# Patient Record
Sex: Male | Born: 1970 | Race: White | Hispanic: No | Marital: Single
Health system: Midwestern US, Academic
[De-identification: ages and names within clinical notes are randomized; demographics above are authoritative.]

---

## 2017-03-12 MED ORDER — THIAMINE MONONITRATE (VIT B1) 100 MG PO TAB
100 mg | Freq: Once | ORAL | 0 refills | Status: CP
Start: 2017-03-12 — End: ?

## 2017-03-12 MED ORDER — CHLORDIAZEPOXIDE HCL 25 MG PO CAP
25 mg | Freq: Once | ORAL | 0 refills | Status: CP
Start: 2017-03-12 — End: ?

## 2017-03-12 MED ORDER — FOLIC ACID 1 MG PO TAB
1 mg | Freq: Once | ORAL | 0 refills | Status: CP
Start: 2017-03-12 — End: ?

## 2017-03-12 MED ORDER — SODIUM CHLORIDE 0.9 % IV SOLP
1000 mL | INTRAVENOUS | 0 refills | Status: CP
Start: 2017-03-12 — End: ?

## 2017-03-19 MED ORDER — ALPRAZOLAM 0.5 MG PO TAB
.5 mg | Freq: Once | ORAL | 0 refills | Status: CP
Start: 2017-03-19 — End: ?

## 2017-05-13 ENCOUNTER — Emergency Department: Admit: 2017-05-13 | Discharge: 2017-05-13

## 2017-05-13 ENCOUNTER — Encounter: Admit: 2017-05-13 | Discharge: 2017-05-13

## 2017-05-13 ENCOUNTER — Emergency Department
Admit: 2017-05-13 | Discharge: 2017-05-13 | Disposition: A | Discharge status: Home / Self Care | Payer: BC Managed Care – PPO

## 2017-05-13 DIAGNOSIS — R569 Unspecified convulsions: Principal | ICD-10-CM

## 2017-05-13 DIAGNOSIS — F10220 Alcohol dependence with intoxication, uncomplicated: Principal | ICD-10-CM

## 2017-05-13 DIAGNOSIS — R531 Weakness: ICD-10-CM

## 2017-05-13 DIAGNOSIS — K859 Acute pancreatitis without necrosis or infection, unspecified: ICD-10-CM

## 2017-05-13 DIAGNOSIS — R410 Disorientation, unspecified: ICD-10-CM

## 2017-05-13 DIAGNOSIS — F1020 Alcohol dependence, uncomplicated: ICD-10-CM

## 2017-05-13 DIAGNOSIS — F4310 Post-traumatic stress disorder, unspecified: ICD-10-CM

## 2017-05-13 DIAGNOSIS — F10129 Alcohol abuse with intoxication, unspecified: ICD-10-CM

## 2017-05-13 DIAGNOSIS — M5126 Other intervertebral disc displacement, lumbar region: ICD-10-CM

## 2017-05-13 DIAGNOSIS — F329 Major depressive disorder, single episode, unspecified: ICD-10-CM

## 2017-05-13 DIAGNOSIS — N200 Calculus of kidney: ICD-10-CM

## 2017-05-13 LAB — COMPREHENSIVE METABOLIC PANEL
Lab: 0.4 mg/dL — ABNORMAL LOW (ref 0.3–1.2)
Lab: 0.7 mg/dL (ref 0.4–1.24)
Lab: 105 MMOL/L (ref 98–110)
Lab: 141 MMOL/L (ref 137–147)
Lab: 16 K/UL — ABNORMAL HIGH (ref 3–12)
Lab: 20 MMOL/L — ABNORMAL LOW (ref 21–30)
Lab: 34 U/L (ref 7–56)
Lab: 42 U/L — ABNORMAL HIGH (ref 7–40)
Lab: 57 U/L (ref 25–110)
Lab: 7.7 g/dL — ABNORMAL HIGH (ref 6.0–8.0)
Lab: 71 mg/dL (ref 70–100)
Lab: 8 mg/dL (ref 7–25)
Lab: 9.2 mg/dL (ref 8.5–10.6)
Lab: >60 mL/min (ref >60)
Lab: >60 mL/min (ref >60)

## 2017-05-13 LAB — CBC AND DIFF
Lab: 0 10*3/uL (ref 0–0.20)
Lab: 0 10*3/uL (ref 0–0.45)
Lab: 4.7 10*3/uL (ref 4.5–11.0)

## 2017-05-13 LAB — ALCOHOL LEVEL: Lab: 308 mg/dL (ref 3.5–5.1)

## 2017-05-13 LAB — ACETAMINOPHEN LEVEL: Lab: <10 ug/mL (ref <20.1)

## 2017-05-13 LAB — TRICYCLIC SCREEN: Lab: NEGATIVE

## 2017-05-13 LAB — SALICYLATE LEVEL

## 2017-05-13 LAB — MAGNESIUM: Lab: 2.1 mg/dL (ref 1.6–2.6)

## 2017-05-13 LAB — PHOSPHORUS: Lab: 2.8 mg/dL (ref 2.0–4.0)

## 2017-05-13 MED ORDER — LORAZEPAM 2 MG/ML IJ SOLN
2 mg | INTRAVENOUS | 0 refills | Status: CN
Start: 2017-05-13 — End: ?

## 2017-05-13 MED ORDER — LORAZEPAM 2 MG/ML IJ SOLN
1 mg | Freq: Two times a day (BID) | INTRAVENOUS | 0 refills | Status: CN
Start: 2017-05-13 — End: ?

## 2017-05-13 MED ORDER — LORAZEPAM 2 MG/ML IJ SOLN
1 mg | INTRAVENOUS | 0 refills | Status: CN
Start: 2017-05-13 — End: ?

## 2017-05-13 MED ORDER — LORAZEPAM 2 MG/ML IJ SOLN
1-2 mg | INTRAVENOUS | 0 refills | Status: CN | PRN
Start: 2017-05-13 — End: ?

## 2017-05-13 MED ORDER — VITAMIN B COMPLEX PO TAB
1 | Freq: Every day | ORAL | 0 refills | Status: CN
Start: 2017-05-13 — End: ?

## 2017-05-13 MED ORDER — THIAMINE MONONITRATE (VIT B1) 100 MG PO TAB
100 mg | Freq: Every day | ORAL | 0 refills | Status: DC
Start: 2017-05-13 — End: 2017-05-13

## 2017-05-13 MED ORDER — BANANA BAG (~~LOC~~) 1000ML
Freq: Once | INTRAVENOUS | 0 refills | Status: CP
Start: 2017-05-13 — End: ?
  Administered 2017-05-13 (×4): 1001.200 mL via INTRAVENOUS

## 2017-05-13 MED ORDER — LORAZEPAM 2 MG/ML IJ SOLN
1 mg | Freq: Once | INTRAVENOUS | 0 refills | Status: DC
Start: 2017-05-13 — End: 2017-05-13

## 2017-05-13 MED ORDER — TRAZODONE 100 MG PO TAB
100 mg | Freq: Every evening | ORAL | 0 refills | Status: CN | PRN
Start: 2017-05-13 — End: ?

## 2017-05-13 MED ORDER — PANTOPRAZOLE 40 MG PO TBEC
40 mg | Freq: Every day | ORAL | 0 refills | Status: CN
Start: 2017-05-13 — End: ?

## 2017-05-13 MED ORDER — FOLIC ACID 1 MG PO TAB
1 mg | Freq: Every day | ORAL | 0 refills | Status: DC
Start: 2017-05-13 — End: 2017-05-13

## 2017-05-13 MED ORDER — LORAZEPAM 2 MG/ML IJ SOLN
2 mg | Freq: Once | INTRAVENOUS | 0 refills | Status: CP
Start: 2017-05-13 — End: ?
  Administered 2017-05-13: 14:00:00 2 mg via INTRAVENOUS

## 2017-05-13 MED ORDER — GABAPENTIN 100 MG PO CAP
100 mg | Freq: Two times a day (BID) | ORAL | 0 refills | Status: CN
Start: 2017-05-13 — End: ?

## 2017-05-13 MED ORDER — THIAMINE MONONITRATE (VIT B1) 100 MG PO TAB
100 mg | Freq: Every day | ORAL | 0 refills | Status: CN
Start: 2017-05-13 — End: ?

## 2017-05-13 MED ORDER — FOLIC ACID 1 MG PO TAB
1 mg | Freq: Every day | ORAL | 0 refills | Status: CN
Start: 2017-05-13 — End: ?

## 2017-05-13 MED ORDER — ESCITALOPRAM OXALATE 10 MG PO TAB
10 mg | Freq: Every day | ORAL | 0 refills | Status: CN
Start: 2017-05-13 — End: ?

## 2017-05-13 NOTE — ED Notes
Patient verbalizes understanding of dc instructions and follow up care.  Ambulates out of Emergency Department without difficulty.

## 2017-05-13 NOTE — ED Notes
Patient removed yellow gown and walking out of ED, Wren notified that patient could not leave until reassessment performed by physician.

## 2017-05-13 NOTE — ED Notes
Unable to find Attending for an EKG reading @0835 . Will continue to attempt.

## 2017-05-13 NOTE — ED Notes
Dr. Allin at bedside to evaluate patient.

## 2017-05-13 NOTE — Care Coordination-Inpatient
Patient does not wish to be admitted at this time. I reviewed with Dr. Zenia Resides. Patient does not express SI/HI, and wishes to be discharged to home.     Dr. Zenia Resides intends to discharge patient. I will discontinue AOD request for admission. Please contact AOD if we can assist in the care of this patient.    Jacqualin Combes, MD  Internal Medicine

## 2017-05-13 NOTE — ED Notes
Patient sitting on bed, refusing to get into gown, IV fluids completed after reconnected, patient wants to go home, states he will sign AMA, informed patient that the presentation he had had MD stating he was unable to make decisions.  Patient A&O x 4, states he needs to go home, sleep then take a shower and go to work.  Patient requesting coffee, Dr. Lance Sell notified of patient's desire to leave, patient given cup of coffee.

## 2017-05-14 ENCOUNTER — Encounter: Admit: 2017-05-14 | Discharge: 2017-05-14

## 2017-05-14 ENCOUNTER — Emergency Department: Admit: 2017-05-14 | Discharge: 2017-05-14 | Attending: Emergency Medical Services

## 2017-05-14 ENCOUNTER — Emergency Department
Admit: 2017-05-14 | Discharge: 2017-05-14 | Disposition: A | Discharge status: Home / Self Care | Payer: BC Managed Care – PPO | Attending: Emergency Medical Services

## 2017-05-14 DIAGNOSIS — N200 Calculus of kidney: ICD-10-CM

## 2017-05-14 DIAGNOSIS — F329 Major depressive disorder, single episode, unspecified: ICD-10-CM

## 2017-05-14 DIAGNOSIS — F4310 Post-traumatic stress disorder, unspecified: ICD-10-CM

## 2017-05-14 DIAGNOSIS — M25571 Pain in right ankle and joints of right foot: Principal | ICD-10-CM

## 2017-05-14 DIAGNOSIS — F10220 Alcohol dependence with intoxication, uncomplicated: Secondary | ICD-10-CM

## 2017-05-14 DIAGNOSIS — F1020 Alcohol dependence, uncomplicated: ICD-10-CM

## 2017-05-14 DIAGNOSIS — M5126 Other intervertebral disc displacement, lumbar region: ICD-10-CM

## 2017-05-14 DIAGNOSIS — K859 Acute pancreatitis without necrosis or infection, unspecified: ICD-10-CM

## 2017-05-14 DIAGNOSIS — R569 Unspecified convulsions: Principal | ICD-10-CM

## 2017-05-14 LAB — POC GLUCOSE: Lab: 79 mg/dL (ref 70–100)

## 2017-05-14 MED ORDER — ACETAMINOPHEN 500 MG PO TAB
1000 mg | Freq: Once | ORAL | 0 refills | Status: CP
Start: 2017-05-14 — End: ?
  Administered 2017-05-14: 22:00:00 1000 mg via ORAL

## 2017-05-14 MED ORDER — FOLIC ACID 1 MG PO TAB
1 mg | Freq: Once | ORAL | 0 refills | Status: CP
Start: 2017-05-14 — End: ?
  Administered 2017-05-14: 22:00:00 1 mg via ORAL

## 2017-05-14 MED ORDER — LACTATED RINGERS IV SOLP
1000 mL | INTRAVENOUS | 0 refills | Status: CP
Start: 2017-05-14 — End: ?
  Administered 2017-05-14: 22:00:00 1000 mL via INTRAVENOUS

## 2017-05-14 MED ORDER — THIAMINE MONONITRATE (VIT B1) 100 MG PO TAB
100 mg | Freq: Once | ORAL | 0 refills | Status: CP
Start: 2017-05-14 — End: ?
  Administered 2017-05-14: 22:00:00 100 mg via ORAL

## 2017-05-14 NOTE — ED Notes
Per Dr. Meryl Crutch PO challenge pt, pt given crackers and water.

## 2017-05-14 NOTE — ED Notes
Discharge instructions discussed with pt. Pt verbalizes understanding, denies further questions or concerns at this time. Pt A&O x 4, resp even and NL, VSS, pt ambulatory with a steady gait. Pt called his wife to pick him up.

## 2017-05-14 NOTE — ED Notes
Pt ambulatory with a steady gait, pt limping slightly due to right ankle pain. Pt states "I'm fine." Primary RN notified.

## 2017-05-24 ENCOUNTER — Emergency Department
Admit: 2017-05-24 | Discharge: 2017-05-24 | Disposition: A | Discharge status: Home / Self Care | Payer: BC Managed Care – PPO

## 2017-05-24 ENCOUNTER — Emergency Department: Admit: 2017-05-24 | Discharge: 2017-05-24

## 2017-05-24 DIAGNOSIS — F1099 Alcohol use, unspecified with unspecified alcohol-induced disorder: Principal | ICD-10-CM

## 2017-05-24 LAB — BASIC METABOLIC PANEL
Lab: 0.7 mg/dL (ref 0.4–1.24)
Lab: 107 mg/dL — ABNORMAL HIGH (ref 70–100)
Lab: 12 pg (ref 3–12)
Lab: 141 MMOL/L (ref 137–147)
Lab: 25 MMOL/L (ref 21–30)
Lab: 3.4 MMOL/L — ABNORMAL LOW (ref 3.5–5.1)
Lab: 8 mg/dL (ref 7–25)
Lab: 9.6 mg/dL — ABNORMAL LOW (ref 8.5–10.6)
Lab: >60 mL/min (ref >60)
Lab: >60 mL/min (ref >60)

## 2017-05-24 LAB — CBC AND DIFF
Lab: 0 % (ref 0–2)
Lab: 0 10*3/uL (ref 0–0.20)
Lab: 0.1 10*3/uL (ref 0–0.45)
Lab: 0.5 10*3/uL (ref 0–0.80)
Lab: 1 % (ref 0–5)
Lab: 1.2 10*3/uL (ref 1.0–4.8)
Lab: 10 % (ref 4–12)
Lab: 3.3 10*3/uL (ref 1.8–7.0)
Lab: 5.1 K/UL (ref 4.5–11.0)

## 2017-05-24 LAB — ALCOHOL LEVEL: Lab: 184 mg/dL — ABNORMAL LOW (ref 98–106)

## 2017-05-24 LAB — MAGNESIUM: Lab: 2.1 mg/dL (ref 1.6–2.6)

## 2017-05-24 MED ORDER — FOLIC ACID 1 MG PO TAB
1 mg | Freq: Once | ORAL | 0 refills | Status: CP
Start: 2017-05-24 — End: ?
  Administered 2017-05-24: 19:00:00 1 mg via ORAL

## 2017-05-24 MED ORDER — PHENOBARBITAL SODIUM 130 MG/ML IJ SOLN
130 mg | Freq: Once | INTRAVENOUS | 0 refills | Status: CP
Start: 2017-05-24 — End: ?
  Administered 2017-05-24: 20:00:00 130 mg via INTRAVENOUS

## 2017-05-24 MED ORDER — LACTATED RINGERS IV SOLP
1000 mL | INTRAVENOUS | 0 refills | Status: CP
Start: 2017-05-24 — End: ?
  Administered 2017-05-24: 19:00:00 1000 mL via INTRAVENOUS

## 2017-05-24 MED ORDER — THIAMINE MONONITRATE (VIT B1) 100 MG PO TAB
100 mg | Freq: Once | ORAL | 0 refills | Status: CP
Start: 2017-05-24 — End: ?
  Administered 2017-05-24: 19:00:00 100 mg via ORAL

## 2017-05-24 MED ORDER — CHLORDIAZEPOXIDE HCL 25 MG PO CAP
ORAL_CAPSULE | ORAL | 0 refills | 5.00000 days | Status: AC
Start: 2017-05-24 — End: 2017-12-30

## 2017-05-24 MED ORDER — PHENOBARBITAL SODIUM 130 MG/ML IJ SOLN
160 mg | Freq: Once | INTRAVENOUS | 0 refills | Status: DC
Start: 2017-05-24 — End: 2017-05-24

## 2017-05-24 NOTE — ED Notes
Patient given discharge instructions. Cab voucher and prescription at triage desk. PIV removed. VSS. Patient ambulated with steady gait to triage to wait for cab.

## 2017-05-24 NOTE — ED Notes
SW notified to work on Clinical cytogeneticist for patient.

## 2017-05-24 NOTE — ED Notes
This staff assisted Dr. Phylliss Bob in finding a Detox for him. This staff called [facility] to make arrangements for him to go to [facility]. Colletta Maryland at [facility] did a phone interview with him and this staff did a F/U call with [facility]. [facility] accepted him and wanted him there at 1730. [facility] asked for a recent BAL and a script for Librium Taper. He received Phenobarb today. Dr. Phylliss Bob wrote a script for Librium and faxed it to [facility]. He will be transported to [facility] by cab voucher. Report to his Nurse.

## 2017-05-24 NOTE — Case Management (ED)
Case Management Progress Note    NAME:Daniel French                          MRN: [id number]              DOB:09/01/1971          AGE: 45 y.o.  ADMISSION DATE: 05/24/2017             DAYS ADMITTED: LOS: 0 days      Todays Date: 05/24/2017    Plan: Pt dc to [facility] with transportation assistance.    Interventions  ? Support      ? Info or Referral      ? Discharge Planning   Discharge Planning: Transportation Arrangements and/or Resources    SW contacted by Denny Peon, Therapist, sports, requesting SW assistance.   Leah reported pt will dc to [facility] but facility requesting pt be present prior to 5:30 and will need transportation assistance.    SW provided Leah with cab voucher for pt dc.  ? Medication Needs      ? Financial      ? Legal      ? Other        Disposition  ? Expected Discharge Date       ? Transportation      ? Next Level of Care (Acute Psych discharges only)      ? Discharge Disposition                                          Durable Medical Equipment     No service has been selected for the patient.      [place] Destination     No service has been selected for the patient.      [place]     No service has been selected for the patient.      [facility]     No service has been selected for the patient.        Maleke Feria SW  [phone]

## 2017-10-14 ENCOUNTER — Encounter: Admit: 2017-10-14 | Discharge: 2017-10-14

## 2017-10-14 MED ORDER — DEXTROAMPHETAMINE-AMPHETAMINE 10 MG PO TAB
10 mg | ORAL_TABLET | Freq: Three times a day (TID) | ORAL | 0 refills | 30.00000 days | Status: DC
Start: 2017-10-14 — End: 2017-11-09
  Filled 2017-10-14 (×2): qty 90, 30d supply, fill #1

## 2017-10-19 ENCOUNTER — Encounter: Admit: 2017-10-19 | Discharge: 2017-10-19

## 2017-10-19 DIAGNOSIS — M5030 Other cervical disc degeneration, unspecified cervical region: ICD-10-CM

## 2017-10-19 DIAGNOSIS — M5412 Radiculopathy, cervical region: Principal | ICD-10-CM

## 2017-10-28 ENCOUNTER — Ambulatory Visit: Admit: 2017-10-28 | Discharge: 2017-10-28

## 2017-10-28 DIAGNOSIS — M5412 Radiculopathy, cervical region: Principal | ICD-10-CM

## 2017-10-28 DIAGNOSIS — M5030 Other cervical disc degeneration, unspecified cervical region: ICD-10-CM

## 2017-11-09 ENCOUNTER — Encounter: Admit: 2017-11-09 | Discharge: 2017-11-09

## 2017-11-09 MED ORDER — DEXTROAMPHETAMINE-AMPHETAMINE 10 MG PO TAB
10 mg | ORAL_TABLET | Freq: Three times a day (TID) | ORAL | 0 refills | Status: DC
Start: 2017-11-09 — End: 2017-12-01
  Filled 2017-11-13 (×2): qty 90, 30d supply, fill #1

## 2017-11-13 ENCOUNTER — Encounter: Admit: 2017-11-13 | Discharge: 2017-11-13

## 2017-12-01 ENCOUNTER — Encounter: Admit: 2017-12-01 | Discharge: 2017-12-01

## 2017-12-01 MED ORDER — DEXTROAMPHETAMINE-AMPHETAMINE 10 MG PO TAB
10 mg | ORAL_TABLET | Freq: Three times a day (TID) | ORAL | 0 refills | Status: DC
Start: 2017-12-01 — End: 2017-12-30
  Filled 2017-12-08 (×2): qty 90, 30d supply, fill #1

## 2017-12-02 ENCOUNTER — Encounter: Admit: 2017-12-02 | Discharge: 2017-12-02

## 2017-12-03 ENCOUNTER — Encounter: Admit: 2017-12-03 | Discharge: 2017-12-03

## 2017-12-05 ENCOUNTER — Encounter: Admit: 2017-12-05 | Discharge: 2017-12-05

## 2017-12-07 ENCOUNTER — Encounter: Admit: 2017-12-07 | Discharge: 2017-12-07

## 2017-12-08 ENCOUNTER — Encounter: Admit: 2017-12-08 | Discharge: 2017-12-08

## 2017-12-08 ENCOUNTER — Ambulatory Visit: Admit: 2017-12-08 | Discharge: 2017-12-09

## 2017-12-08 DIAGNOSIS — R569 Unspecified convulsions: Principal | ICD-10-CM

## 2017-12-08 DIAGNOSIS — K859 Acute pancreatitis without necrosis or infection, unspecified: ICD-10-CM

## 2017-12-08 DIAGNOSIS — N200 Calculus of kidney: ICD-10-CM

## 2017-12-08 DIAGNOSIS — F4310 Post-traumatic stress disorder, unspecified: ICD-10-CM

## 2017-12-08 DIAGNOSIS — F329 Major depressive disorder, single episode, unspecified: ICD-10-CM

## 2017-12-08 DIAGNOSIS — M5126 Other intervertebral disc displacement, lumbar region: ICD-10-CM

## 2017-12-08 DIAGNOSIS — F1020 Alcohol dependence, uncomplicated: ICD-10-CM

## 2017-12-08 MED ORDER — OXYCODONE-ACETAMINOPHEN 5-325 MG PO TAB
1 | ORAL_TABLET | Freq: Every evening | ORAL | 0 refills | 2.00000 days | Status: AC | PRN
Start: 2017-12-08 — End: 2017-12-30
  Filled 2017-12-08 (×2): qty 20, 20d supply, fill #1

## 2017-12-08 MED ORDER — MELOXICAM 7.5 MG PO TAB
7.5 mg | ORAL_TABLET | Freq: Every day | ORAL | 3 refills | 30.00000 days | Status: AC
Start: 2017-12-08 — End: 2018-09-27
  Filled 2017-12-12 (×2): qty 60, 60d supply, fill #1

## 2017-12-09 DIAGNOSIS — M5412 Radiculopathy, cervical region: ICD-10-CM

## 2017-12-09 DIAGNOSIS — M5030 Other cervical disc degeneration, unspecified cervical region: Principal | ICD-10-CM

## 2017-12-12 ENCOUNTER — Encounter: Admit: 2017-12-12 | Discharge: 2017-12-12

## 2017-12-30 ENCOUNTER — Ambulatory Visit: Admit: 2017-12-30 | Discharge: 2017-12-31

## 2017-12-30 ENCOUNTER — Encounter: Admit: 2017-12-30 | Discharge: 2017-12-30

## 2017-12-30 DIAGNOSIS — F1020 Alcohol dependence, uncomplicated: ICD-10-CM

## 2017-12-30 DIAGNOSIS — F329 Major depressive disorder, single episode, unspecified: ICD-10-CM

## 2017-12-30 DIAGNOSIS — M542 Cervicalgia: Principal | ICD-10-CM

## 2017-12-30 DIAGNOSIS — F4310 Post-traumatic stress disorder, unspecified: ICD-10-CM

## 2017-12-30 DIAGNOSIS — M5126 Other intervertebral disc displacement, lumbar region: ICD-10-CM

## 2017-12-30 DIAGNOSIS — M5030 Other cervical disc degeneration, unspecified cervical region: Principal | ICD-10-CM

## 2017-12-30 DIAGNOSIS — R569 Unspecified convulsions: Principal | ICD-10-CM

## 2017-12-30 DIAGNOSIS — N200 Calculus of kidney: ICD-10-CM

## 2017-12-30 DIAGNOSIS — K859 Acute pancreatitis without necrosis or infection, unspecified: ICD-10-CM

## 2017-12-30 MED ORDER — TRIAMCINOLONE ACETONIDE 40 MG/ML IJ SUSP
80 mg | Freq: Once | EPIDURAL | 0 refills | Status: CP
Start: 2017-12-30 — End: ?
  Administered 2017-12-30: 14:00:00 80 mg via EPIDURAL

## 2017-12-30 MED ORDER — OXYCODONE-ACETAMINOPHEN 5-325 MG PO TAB
1 | ORAL_TABLET | Freq: Every evening | ORAL | 0 refills | 2.00000 days | Status: AC | PRN
Start: 2017-12-30 — End: 2018-09-27
  Filled 2017-12-30 (×2): qty 20, 20d supply, fill #1

## 2017-12-30 MED ORDER — TRIAMCINOLONE ACETONIDE 40 MG/ML IJ SUSP
80 mg | Freq: Once | EPIDURAL | 0 refills | Status: DC
Start: 2017-12-30 — End: 2017-12-30

## 2017-12-30 MED ORDER — IOPAMIDOL 41 % IT SOLN
2.5 mL | Freq: Once | EPIDURAL | 0 refills | Status: CP
Start: 2017-12-30 — End: ?
  Administered 2017-12-30: 14:00:00 2.5 mL via EPIDURAL

## 2017-12-30 MED ORDER — IOPAMIDOL 41 % IT SOLN
2.5 mL | Freq: Once | EPIDURAL | 0 refills | Status: DC
Start: 2017-12-30 — End: 2017-12-30

## 2017-12-31 ENCOUNTER — Ambulatory Visit: Admit: 2017-12-30 | Discharge: 2017-12-31

## 2017-12-31 DIAGNOSIS — M5412 Radiculopathy, cervical region: ICD-10-CM

## 2017-12-31 DIAGNOSIS — F329 Major depressive disorder, single episode, unspecified: ICD-10-CM

## 2017-12-31 DIAGNOSIS — F4310 Post-traumatic stress disorder, unspecified: ICD-10-CM

## 2018-01-04 ENCOUNTER — Encounter: Admit: 2018-01-04 | Discharge: 2018-01-04

## 2018-01-04 MED ORDER — DEXTROAMPHETAMINE-AMPHETAMINE 10 MG PO TAB
10 mg | ORAL_TABLET | Freq: Three times a day (TID) | ORAL | 0 refills | 30.00000 days | Status: DC
Start: 2018-01-04 — End: 2018-10-06
  Filled 2018-01-04 (×2): qty 90, 30d supply, fill #1

## 2018-01-12 ENCOUNTER — Encounter: Admit: 2018-01-12 | Discharge: 2018-01-12

## 2018-01-28 ENCOUNTER — Encounter: Admit: 2018-01-28 | Discharge: 2018-01-28

## 2018-01-28 DIAGNOSIS — N200 Calculus of kidney: ICD-10-CM

## 2018-01-28 DIAGNOSIS — F329 Major depressive disorder, single episode, unspecified: ICD-10-CM

## 2018-01-28 DIAGNOSIS — R569 Unspecified convulsions: Principal | ICD-10-CM

## 2018-01-28 DIAGNOSIS — F4310 Post-traumatic stress disorder, unspecified: ICD-10-CM

## 2018-01-28 DIAGNOSIS — F1020 Alcohol dependence, uncomplicated: ICD-10-CM

## 2018-01-28 DIAGNOSIS — M5126 Other intervertebral disc displacement, lumbar region: ICD-10-CM

## 2018-01-28 DIAGNOSIS — F10129 Alcohol abuse with intoxication, unspecified: ICD-10-CM

## 2018-01-28 DIAGNOSIS — K859 Acute pancreatitis without necrosis or infection, unspecified: ICD-10-CM

## 2018-01-29 ENCOUNTER — Emergency Department
Admit: 2018-01-29 | Discharge: 2018-01-29 | Discharge status: Against Medical Advice | Attending: Student in an Organized Health Care Education/Training Program

## 2018-01-29 DIAGNOSIS — Z5321 Procedure and treatment not carried out due to patient leaving prior to being seen by health care provider: Principal | ICD-10-CM

## 2018-02-02 ENCOUNTER — Encounter: Admit: 2018-02-02 | Discharge: 2018-02-02

## 2018-02-02 ENCOUNTER — Emergency Department: Admit: 2018-02-02 | Discharge: 2018-02-03

## 2018-02-02 ENCOUNTER — Emergency Department: Admit: 2018-02-02 | Discharge: 2018-02-02 | Disposition: A | Discharge status: Home / Self Care

## 2018-02-02 ENCOUNTER — Emergency Department: Admit: 2018-02-02 | Discharge: 2018-02-02

## 2018-02-02 DIAGNOSIS — N200 Calculus of kidney: ICD-10-CM

## 2018-02-02 DIAGNOSIS — R569 Unspecified convulsions: Principal | ICD-10-CM

## 2018-02-02 DIAGNOSIS — F329 Major depressive disorder, single episode, unspecified: ICD-10-CM

## 2018-02-02 DIAGNOSIS — F10129 Alcohol abuse with intoxication, unspecified: ICD-10-CM

## 2018-02-02 DIAGNOSIS — R030 Elevated blood-pressure reading, without diagnosis of hypertension: ICD-10-CM

## 2018-02-02 DIAGNOSIS — S0081XA Abrasion of other part of head, initial encounter: ICD-10-CM

## 2018-02-02 DIAGNOSIS — R000 Tachycardia, unspecified: ICD-10-CM

## 2018-02-02 DIAGNOSIS — F4310 Post-traumatic stress disorder, unspecified: ICD-10-CM

## 2018-02-02 DIAGNOSIS — F1010 Alcohol abuse, uncomplicated: Principal | ICD-10-CM

## 2018-02-02 DIAGNOSIS — F1020 Alcohol dependence, uncomplicated: ICD-10-CM

## 2018-02-02 DIAGNOSIS — K859 Acute pancreatitis without necrosis or infection, unspecified: ICD-10-CM

## 2018-02-02 DIAGNOSIS — M5126 Other intervertebral disc displacement, lumbar region: ICD-10-CM

## 2018-02-02 LAB — COMPREHENSIVE METABOLIC PANEL
Lab: 0.6 mg/dL (ref 0.4–1.24)
Lab: 104 mg/dL — ABNORMAL HIGH (ref 70–100)
Lab: 14 mg/dL (ref 7–25)
Lab: 140 MMOL/L — ABNORMAL LOW (ref 137–147)
Lab: 15 10*3/uL — ABNORMAL HIGH (ref 3–12)
Lab: 24 MMOL/L (ref 21–30)
Lab: 34 U/L (ref 7–56)
Lab: 35 U/L (ref 7–40)
Lab: 7.1 g/dL (ref 6.0–8.0)
Lab: 8.7 mg/dL (ref 8.5–10.6)
Lab: >60 mL/min (ref >60)
Lab: >60 mL/min (ref >60)

## 2018-02-02 LAB — CBC AND DIFF
Lab: 0 10*3/uL (ref 0–0.20)
Lab: 0 10*3/uL (ref 0–0.45)
Lab: 5.6 10*3/uL (ref 4.5–11.0)

## 2018-02-02 LAB — POC GLUCOSE: Lab: 126 mg/dL — ABNORMAL HIGH (ref 70–100)

## 2018-02-02 MED ORDER — LACTATED RINGERS IV SOLP
1000 mL | INTRAVENOUS | 0 refills | Status: CP
Start: 2018-02-02 — End: ?
  Administered 2018-02-02: 12:00:00 1000 mL via INTRAVENOUS

## 2018-02-02 MED ORDER — FOLIC ACID 1 MG PO TAB
1 mg | Freq: Once | ORAL | 0 refills | Status: CP
Start: 2018-02-02 — End: ?
  Administered 2018-02-02: 12:00:00 1 mg via ORAL

## 2018-02-02 MED ORDER — SODIUM CHLORIDE 0.9 % IV SOLP
INTRAVENOUS | 0 refills | Status: DC
Start: 2018-02-02 — End: 2018-02-03

## 2018-02-02 MED ORDER — LACTATED RINGERS IV SOLP
1000 mL | INTRAVENOUS | 0 refills | Status: CP
Start: 2018-02-02 — End: ?
  Administered 2018-02-02: 20:00:00 1000 mL via INTRAVENOUS

## 2018-02-02 MED ORDER — THIAMINE MONONITRATE (VIT B1) 100 MG PO TAB
100 mg | Freq: Once | ORAL | 0 refills | Status: CP
Start: 2018-02-02 — End: ?
  Administered 2018-02-02: 12:00:00 100 mg via ORAL

## 2018-02-03 ENCOUNTER — Emergency Department: Admit: 2018-02-03 | Discharge: 2018-02-03

## 2018-02-03 ENCOUNTER — Encounter: Admit: 2018-02-03 | Discharge: 2018-02-03

## 2018-02-03 DIAGNOSIS — S0181XA Laceration without foreign body of other part of head, initial encounter: ICD-10-CM

## 2018-02-03 DIAGNOSIS — M542 Cervicalgia: ICD-10-CM

## 2018-02-03 LAB — BASIC METABOLIC PANEL
Lab: 127 mg/dL — ABNORMAL HIGH (ref 70–100)
Lab: 141 MMOL/L (ref 137–147)
Lab: 15 % — ABNORMAL HIGH (ref 3–12)
Lab: 27 MMOL/L (ref 21–30)
Lab: 4 MMOL/L (ref 3.5–5.1)
Lab: 8.9 mg/dL (ref 8.5–10.6)
Lab: 99 MMOL/L (ref 98–110)
Lab: >60 mL/min (ref >60)
Lab: 0.6 mg/dL (ref 0.4–1.24)
Lab: 10 mg/dL (ref 7–25)
Lab: 100 MMOL/L (ref 98–110)
Lab: 13 — ABNORMAL HIGH (ref 3–12)
Lab: 138 MMOL/L (ref 137–147)
Lab: 25 MMOL/L (ref 21–30)
Lab: 3.6 MMOL/L (ref 3.5–5.1)
Lab: 79 mg/dL (ref 70–100)
Lab: 8.3 mg/dL — ABNORMAL LOW (ref 8.5–10.6)
Lab: >60 mL/min (ref >60)
Lab: >60 mL/min (ref >60)

## 2018-02-03 LAB — BETA-HCG: Lab: <1 U/L (ref <5)

## 2018-02-03 LAB — PROTIME INR (PT): Lab: 1 M/UL (ref 0.8–1.2)

## 2018-02-03 LAB — AMPHETAMINES-URINE RANDOM: Lab: POSITIVE — AB

## 2018-02-03 LAB — CREATINE KINASE-CPK: Lab: 821 U/L — ABNORMAL HIGH (ref 35–232)

## 2018-02-03 LAB — OPIATES-URINE RANDOM: Lab: NEGATIVE

## 2018-02-03 LAB — LACTIC ACID (BG - RAPID LACTATE): Lab: 7.1 MMOL/L — ABNORMAL HIGH (ref 0.5–2.0)

## 2018-02-03 LAB — BETA HYDROXYBUTYRATE (KETONES): Lab: 0.2 MMOL/L (ref <0.3)

## 2018-02-03 LAB — HEMOGLOBIN & HEMATOCRIT
Lab: 12 g/dL — ABNORMAL LOW (ref 13.5–16.5)
Lab: 36 % — ABNORMAL LOW (ref 40–50)

## 2018-02-03 LAB — ALCOHOL LEVEL: Lab: 497 mg/dL (ref 0.4–1.24)

## 2018-02-03 LAB — LACTIC ACID(LACTATE): Lab: 3.8 MMOL/L — ABNORMAL HIGH (ref 0.5–2.0)

## 2018-02-03 LAB — BARBITURATES-URINE RANDOM: Lab: NEGATIVE

## 2018-02-03 LAB — CBC: Lab: 5.8 10*3/uL (ref 4.5–11.0)

## 2018-02-03 LAB — BENZODIAZEPINES-URINE RANDOM: Lab: NEGATIVE

## 2018-02-03 LAB — ACETAMINOPHEN LEVEL: Lab: <10 ug/mL (ref <20.1)

## 2018-02-03 LAB — PHENCYCLIDINES-URINE RANDOM: Lab: NEGATIVE

## 2018-02-03 LAB — COCAINE-URINE RANDOM: Lab: NEGATIVE

## 2018-02-03 LAB — POC LACTATE: Lab: 4.7 MMOL/L — ABNORMAL HIGH (ref 0.5–2.0)

## 2018-02-03 LAB — SALICYLATE LEVEL

## 2018-02-03 LAB — TROPONIN-I: Lab: 0 ng/mL (ref 0.0–0.05)

## 2018-02-03 LAB — PTT (APTT): Lab: 22 s — ABNORMAL LOW (ref 24.0–36.5)

## 2018-02-03 LAB — CANNABINOIDS-URINE RANDOM: Lab: NEGATIVE

## 2018-02-03 MED ORDER — LORAZEPAM 2 MG/ML IJ SOLN
1-2 mg | INTRAVENOUS | 0 refills | Status: DC | PRN
Start: 2018-02-03 — End: 2018-02-07
  Administered 2018-02-03 – 2018-02-04 (×2): 1 mg via INTRAVENOUS
  Administered 2018-02-04 – 2018-02-05 (×3): 2 mg via INTRAVENOUS
  Administered 2018-02-05: 03:00:00 1 mg via INTRAVENOUS
  Administered 2018-02-05 – 2018-02-07 (×8): 2 mg via INTRAVENOUS

## 2018-02-03 MED ORDER — GABAPENTIN 300 MG PO CAP
1200 mg | Freq: Once | ORAL | 0 refills | Status: CP
Start: 2018-02-03 — End: ?
  Administered 2018-02-03: 18:00:00 1200 mg via ORAL

## 2018-02-03 MED ORDER — ENOXAPARIN 40 MG/0.4 ML SC SYRG
40 mg | Freq: Every day | SUBCUTANEOUS | 0 refills | Status: DC
Start: 2018-02-03 — End: 2018-02-08
  Administered 2018-02-04 – 2018-02-08 (×3): 40 mg via SUBCUTANEOUS

## 2018-02-03 MED ORDER — TRAMADOL 50 MG PO TAB
50 mg | ORAL | 0 refills | Status: DC | PRN
Start: 2018-02-03 — End: 2018-02-05
  Administered 2018-02-03 – 2018-02-05 (×6): 50 mg via ORAL

## 2018-02-03 MED ORDER — ONDANSETRON HCL (PF) 4 MG/2 ML IJ SOLN
4-8 mg | INTRAVENOUS | 0 refills | Status: DC | PRN
Start: 2018-02-03 — End: 2018-02-08

## 2018-02-03 MED ORDER — FENTANYL CITRATE (PF) 50 MCG/ML IJ SOLN
25-50 ug | INTRAVENOUS | 0 refills | Status: DC | PRN
Start: 2018-02-03 — End: 2018-02-03
  Administered 2018-02-03: 14:00:00 25 ug via INTRAVENOUS
  Administered 2018-02-03: 11:00:00 50 ug via INTRAVENOUS
  Administered 2018-02-03 (×2): 25 ug via INTRAVENOUS
  Administered 2018-02-03: 12:00:00 50 ug via INTRAVENOUS

## 2018-02-03 MED ORDER — IMS MIXTURE TEMPLATE
800 mg | Freq: Three times a day (TID) | ORAL | 0 refills | Status: CP
Start: 2018-02-03 — End: ?
  Administered 2018-02-04 – 2018-02-06 (×16): 800 mg via ORAL

## 2018-02-03 MED ORDER — DOCUSATE SODIUM 100 MG PO CAP
100 mg | Freq: Every day | ORAL | 0 refills | Status: DC | PRN
Start: 2018-02-03 — End: 2018-02-08

## 2018-02-03 MED ORDER — THIAMINE MONONITRATE (VIT B1) 100 MG PO TAB
100 mg | Freq: Every day | ORAL | 0 refills | Status: DC
Start: 2018-02-03 — End: 2018-02-08
  Administered 2018-02-04 – 2018-02-08 (×5): 100 mg via ORAL

## 2018-02-03 MED ORDER — CLONIDINE HCL 0.1 MG PO TAB
.1 mg | Freq: Two times a day (BID) | ORAL | 0 refills | Status: DC | PRN
Start: 2018-02-03 — End: 2018-02-08
  Administered 2018-02-05 – 2018-02-06 (×2): 0.1 mg via ORAL

## 2018-02-03 MED ORDER — BANANA BAG (~~LOC~~) 1000ML
Freq: Once | INTRAVENOUS | 0 refills | Status: CP
Start: 2018-02-03 — End: ?
  Administered 2018-02-03 (×4): 1001.200 mL via INTRAVENOUS

## 2018-02-03 MED ORDER — SODIUM CHLORIDE 0.45 % IV SOLP
INTRAVENOUS | 0 refills | Status: AC
Start: 2018-02-03 — End: ?
  Administered 2018-02-03 – 2018-02-05 (×6): 1000.000 mL via INTRAVENOUS

## 2018-02-03 MED ORDER — LACTATED RINGERS IV SOLP
1000 mL | INTRAVENOUS | 0 refills | Status: CP
Start: 2018-02-03 — End: ?
  Administered 2018-02-03: 07:00:00 1000 mL via INTRAVENOUS

## 2018-02-03 MED ORDER — LIDOCAINE 5 % TP PTMD
1-2 | Freq: Every day | TOPICAL | 0 refills | Status: DC
Start: 2018-02-03 — End: 2018-02-08
  Administered 2018-02-03 – 2018-02-04 (×2): 2 via TOPICAL
  Administered 2018-02-05 – 2018-02-08 (×4): 1 via TOPICAL

## 2018-02-03 MED ORDER — MORPHINE 2 MG/ML IV CRTG
1-2 mg | INTRAVENOUS | 0 refills | Status: DC | PRN
Start: 2018-02-03 — End: 2018-02-04
  Administered 2018-02-03: 23:00:00 2 mg via INTRAVENOUS
  Administered 2018-02-03 – 2018-02-04 (×3): 1 mg via INTRAVENOUS

## 2018-02-03 MED ORDER — MELATONIN 3 MG PO TAB
3 mg | Freq: Every evening | ORAL | 0 refills | Status: DC | PRN
Start: 2018-02-03 — End: 2018-02-08
  Administered 2018-02-06 – 2018-02-08 (×3): 3 mg via ORAL

## 2018-02-03 MED ORDER — LORAZEPAM 2 MG/ML IJ SOLN
1-2 mg | Freq: Once | INTRAVENOUS | 0 refills | Status: CP
Start: 2018-02-03 — End: ?
  Administered 2018-02-03: 17:00:00 1 mg via INTRAVENOUS

## 2018-02-03 MED ORDER — ACETAMINOPHEN 325 MG PO TAB
650 mg | ORAL | 0 refills | Status: DC
Start: 2018-02-03 — End: 2018-02-08
  Administered 2018-02-03 – 2018-02-08 (×16): 650 mg via ORAL

## 2018-02-03 MED ORDER — BACITRACIN ZINC 500 UNIT/GRAM TP OINT
Freq: Two times a day (BID) | TOPICAL | 0 refills | Status: DC
Start: 2018-02-03 — End: 2018-02-08
  Administered 2018-02-03 – 2018-02-05 (×5): via TOPICAL

## 2018-02-03 MED ORDER — SODIUM CHLORIDE 0.9 % IJ SOLN
50 mL | Freq: Once | INTRAVENOUS | 0 refills | Status: CP
Start: 2018-02-03 — End: ?
  Administered 2018-02-03: 07:00:00 50 mL via INTRAVENOUS

## 2018-02-03 MED ORDER — BANANA BAG (~~LOC~~) 1000ML
Freq: Once | INTRAVENOUS | 0 refills | Status: DC
Start: 2018-02-03 — End: 2018-02-03

## 2018-02-03 MED ORDER — FOLIC ACID 1 MG PO TAB
1 mg | Freq: Every day | ORAL | 0 refills | Status: DC
Start: 2018-02-03 — End: 2018-02-08
  Administered 2018-02-04 – 2018-02-08 (×5): 1 mg via ORAL

## 2018-02-03 MED ORDER — GABAPENTIN 300 MG PO CAP
300 mg | Freq: Three times a day (TID) | ORAL | 0 refills | Status: DC
Start: 2018-02-03 — End: 2018-02-08

## 2018-02-03 MED ORDER — ACETAMINOPHEN 325 MG PO TAB
650 mg | ORAL | 0 refills | Status: DC | PRN
Start: 2018-02-03 — End: 2018-02-08
  Administered 2018-02-08: 18:00:00 650 mg via ORAL

## 2018-02-03 MED ORDER — LACTATED RINGERS IV SOLP
1000 mL | INTRAVENOUS | 0 refills | Status: CP
Start: 2018-02-03 — End: ?
  Administered 2018-02-03: 12:00:00 1000 mL via INTRAVENOUS

## 2018-02-03 MED ORDER — IOHEXOL 350 MG IODINE/ML IV SOLN
100 mL | Freq: Once | INTRAVENOUS | 0 refills | Status: CP
Start: 2018-02-03 — End: ?
  Administered 2018-02-03: 07:00:00 100 mL via INTRAVENOUS

## 2018-02-03 MED ORDER — SODIUM CHLORIDE 0.9 % IR SOLN
Freq: Once | 0 refills | Status: CP
Start: 2018-02-03 — End: ?
  Administered 2018-02-03: 08:00:00 1000.000 mL

## 2018-02-03 MED ORDER — MULTIVIT-IRON-FA-CALCIUM-MINS 9 MG IRON-400 MCG PO TAB
1 | Freq: Every day | ORAL | 0 refills | Status: DC
Start: 2018-02-03 — End: 2018-02-08
  Administered 2018-02-04 – 2018-02-08 (×5): 1 via ORAL

## 2018-02-03 MED ORDER — GABAPENTIN 300 MG PO CAP
600 mg | Freq: Three times a day (TID) | ORAL | 0 refills | Status: DC
Start: 2018-02-03 — End: 2018-02-08
  Administered 2018-02-07 – 2018-02-08 (×5): 600 mg via ORAL

## 2018-02-03 MED ORDER — THIAMINE HCL (VITAMIN B1) 100 MG/ML IJ SOLN
100 mg | Freq: Once | INTRAVENOUS | 0 refills | Status: CP
Start: 2018-02-03 — End: ?
  Administered 2018-02-03: 17:00:00 100 mg via INTRAVENOUS

## 2018-02-04 ENCOUNTER — Encounter: Admit: 2018-02-04 | Discharge: 2018-02-04

## 2018-02-04 DIAGNOSIS — S0181XA Laceration without foreign body of other part of head, initial encounter: ICD-10-CM

## 2018-02-04 LAB — BASIC METABOLIC PANEL
Lab: 0.6 mg/dL (ref 0.4–1.24)
Lab: 10 mg/dL (ref 7–25)
Lab: 136 MMOL/L — ABNORMAL LOW (ref 137–147)
Lab: 27 MMOL/L (ref 21–30)
Lab: 3.5 MMOL/L (ref 3.5–5.1)
Lab: 8.8 mg/dL (ref 8.5–10.6)
Lab: 95 mg/dL (ref 70–100)
Lab: >60 mL/min (ref >60)
Lab: >60 mL/min (ref >60)
Lab: 137 MMOL/L — ABNORMAL LOW (ref 137–147)

## 2018-02-04 LAB — PHOSPHORUS: Lab: 3.5 mg/dL — ABNORMAL HIGH (ref 2.0–4.5)

## 2018-02-04 LAB — HEMOGLOBIN & HEMATOCRIT
Lab: 12 g/dL — ABNORMAL LOW (ref 13.5–16.5)
Lab: 37 % — ABNORMAL LOW (ref 40–50)

## 2018-02-04 LAB — LACTIC ACID(LACTATE): Lab: 2.8 MMOL/L — ABNORMAL HIGH (ref 0.5–2.0)

## 2018-02-04 LAB — MAGNESIUM: Lab: 1.7 mg/dL — ABNORMAL HIGH (ref 1.6–2.6)

## 2018-02-04 LAB — CBC AND DIFF: Lab: 4.7 K/UL — ABNORMAL LOW (ref 4.5–11.0)

## 2018-02-05 ENCOUNTER — Inpatient Hospital Stay: Admit: 2018-02-05 | Discharge: 2018-02-05

## 2018-02-05 DIAGNOSIS — S0181XA Laceration without foreign body of other part of head, initial encounter: Secondary | ICD-10-CM

## 2018-02-05 LAB — PHOSPHORUS: Lab: 3.5 mg/dL — ABNORMAL LOW (ref 2.0–4.5)

## 2018-02-05 LAB — MAGNESIUM: Lab: 1.9 mg/dL — ABNORMAL LOW (ref 1.6–2.6)

## 2018-02-05 LAB — CBC AND DIFF: Lab: 3.8 K/UL — ABNORMAL LOW (ref 4.5–11.0)

## 2018-02-05 LAB — BASIC METABOLIC PANEL: Lab: 139 MMOL/L — ABNORMAL LOW (ref >60)

## 2018-02-05 MED ORDER — TRAMADOL 50 MG PO TAB
50-100 mg | ORAL | 0 refills | Status: DC | PRN
Start: 2018-02-05 — End: 2018-02-07
  Administered 2018-02-06 (×3): 100 mg via ORAL
  Administered 2018-02-06: 01:00:00 50 mg via ORAL
  Administered 2018-02-07 (×2): 100 mg via ORAL

## 2018-02-06 LAB — CBC AND DIFF: Lab: 4.9 K/UL (ref >60)

## 2018-02-06 LAB — BASIC METABOLIC PANEL: Lab: 136 MMOL/L — ABNORMAL LOW (ref 137–147)

## 2018-02-07 LAB — CBC AND DIFF: Lab: 5.1 K/UL — ABNORMAL LOW (ref >60)

## 2018-02-07 LAB — BASIC METABOLIC PANEL: Lab: 140 MMOL/L — ABNORMAL LOW (ref 137–147)

## 2018-02-07 MED ORDER — TRAMADOL 50 MG PO TAB
50 mg | ORAL | 0 refills | Status: DC | PRN
Start: 2018-02-07 — End: 2018-02-08
  Administered 2018-02-07 – 2018-02-08 (×4): 50 mg via ORAL

## 2018-02-07 MED ORDER — LORAZEPAM 1 MG PO TAB
1 mg | ORAL | 0 refills | Status: DC | PRN
Start: 2018-02-07 — End: 2018-02-08
  Administered 2018-02-08 (×5): 1 mg via ORAL

## 2018-02-08 ENCOUNTER — Inpatient Hospital Stay: Admit: 2018-02-08 | Discharge: 2018-02-08

## 2018-02-08 ENCOUNTER — Encounter: Admit: 2018-02-08 | Discharge: 2018-02-08

## 2018-02-08 ENCOUNTER — Emergency Department: Admit: 2018-02-03 | Discharge: 2018-02-04

## 2018-02-08 ENCOUNTER — Emergency Department: Admit: 2018-02-08 | Discharge: 2018-02-09

## 2018-02-08 ENCOUNTER — Emergency Department: Admit: 2018-02-03 | Discharge: 2018-02-03

## 2018-02-08 ENCOUNTER — Inpatient Hospital Stay: Admit: 2018-02-05 | Discharge: 2018-02-05

## 2018-02-08 ENCOUNTER — Inpatient Hospital Stay
Admit: 2018-02-03 | Discharge: 2018-02-08 | Disposition: A | Discharge status: Home / Self Care | Payer: BC Managed Care – PPO

## 2018-02-08 DIAGNOSIS — S0003XA Contusion of scalp, initial encounter: ICD-10-CM

## 2018-02-08 DIAGNOSIS — F17220 Nicotine dependence, chewing tobacco, uncomplicated: ICD-10-CM

## 2018-02-08 DIAGNOSIS — E872 Acidosis: ICD-10-CM

## 2018-02-08 DIAGNOSIS — M19019 Primary osteoarthritis, unspecified shoulder: ICD-10-CM

## 2018-02-08 DIAGNOSIS — F10229 Alcohol dependence with intoxication, unspecified: Principal | ICD-10-CM

## 2018-02-08 DIAGNOSIS — M4722 Other spondylosis with radiculopathy, cervical region: ICD-10-CM

## 2018-02-08 DIAGNOSIS — F10239 Alcohol dependence with withdrawal, unspecified: ICD-10-CM

## 2018-02-08 DIAGNOSIS — F329 Major depressive disorder, single episode, unspecified: ICD-10-CM

## 2018-02-08 DIAGNOSIS — F4325 Adjustment disorder with mixed disturbance of emotions and conduct: ICD-10-CM

## 2018-02-08 DIAGNOSIS — M4802 Spinal stenosis, cervical region: ICD-10-CM

## 2018-02-08 DIAGNOSIS — S0181XA Laceration without foreign body of other part of head, initial encounter: ICD-10-CM

## 2018-02-08 DIAGNOSIS — K760 Fatty (change of) liver, not elsewhere classified: ICD-10-CM

## 2018-02-08 DIAGNOSIS — K859 Acute pancreatitis without necrosis or infection, unspecified: ICD-10-CM

## 2018-02-08 DIAGNOSIS — F1020 Alcohol dependence, uncomplicated: ICD-10-CM

## 2018-02-08 DIAGNOSIS — M5126 Other intervertebral disc displacement, lumbar region: ICD-10-CM

## 2018-02-08 DIAGNOSIS — F4310 Post-traumatic stress disorder, unspecified: ICD-10-CM

## 2018-02-08 DIAGNOSIS — R569 Unspecified convulsions: Principal | ICD-10-CM

## 2018-02-08 DIAGNOSIS — N200 Calculus of kidney: ICD-10-CM

## 2018-02-08 LAB — ALCOHOL LEVEL: Lab: 21 mg/dL

## 2018-02-08 LAB — PHENCYCLIDINES-URINE RANDOM: Lab: NEGATIVE

## 2018-02-08 LAB — CANNABINOIDS-URINE RANDOM: Lab: NEGATIVE

## 2018-02-08 LAB — BARBITURATES-URINE RANDOM: Lab: NEGATIVE

## 2018-02-08 LAB — COCAINE-URINE RANDOM: Lab: NEGATIVE

## 2018-02-08 LAB — OPIATES-URINE RANDOM: Lab: NEGATIVE

## 2018-02-08 LAB — BENZODIAZEPINES-URINE RANDOM: Lab: NEGATIVE

## 2018-02-08 LAB — AMPHETAMINES-URINE RANDOM: Lab: NEGATIVE

## 2018-02-08 LAB — BASIC METABOLIC PANEL: Lab: 140 MMOL/L — ABNORMAL LOW (ref >60)

## 2018-02-08 LAB — CBC AND DIFF: Lab: 5.4 K/UL — ABNORMAL LOW (ref >60)

## 2018-02-08 MED ORDER — POTASSIUM CHLORIDE 20 MEQ PO TBTQ
40 meq | ORAL | 0 refills | Status: CP
Start: 2018-02-08 — End: ?
  Administered 2018-02-08 (×2): 40 meq via ORAL

## 2018-02-08 MED ORDER — FOLIC ACID 1 MG PO TAB
1 mg | ORAL_TABLET | Freq: Every day | ORAL | 0 refills | Status: AC
Start: 2018-02-08 — End: 2018-11-15

## 2018-02-08 MED ORDER — LORAZEPAM 2 MG/ML IJ SOLN
2 mg | Freq: Once | INTRAVENOUS | 0 refills | Status: CP
Start: 2018-02-08 — End: ?

## 2018-02-08 MED ORDER — IOPAMIDOL 41 % IT SOLN
2.5 mL | Freq: Once | EPIDURAL | 0 refills | Status: CP
Start: 2018-02-08 — End: ?
  Administered 2018-02-08: 18:00:00 2.5 mL via EPIDURAL

## 2018-02-08 MED ORDER — LACTATED RINGERS IV SOLP
1000 mL | INTRAVENOUS | 0 refills | Status: DC
Start: 2018-02-08 — End: 2018-02-09

## 2018-02-08 MED ORDER — TRIAMCINOLONE ACETONIDE 40 MG/ML IJ SUSP
80 mg | Freq: Once | EPIDURAL | 0 refills | Status: CP
Start: 2018-02-08 — End: ?
  Administered 2018-02-08: 18:00:00 80 mg via EPIDURAL

## 2018-02-08 MED ORDER — TRAMADOL 50 MG PO TAB
50 mg | ORAL_TABLET | ORAL | 0 refills | Status: AC | PRN
Start: 2018-02-08 — End: 2018-09-27

## 2018-02-08 MED ORDER — HYDROCODONE-ACETAMINOPHEN 7.5-325 MG PO TAB
1 | ORAL_TABLET | ORAL | 1 refills | Status: CN | PRN
Start: 2018-02-08 — End: ?

## 2018-02-08 MED ORDER — GABAPENTIN 300 MG PO CAP
300 mg | ORAL_CAPSULE | Freq: Three times a day (TID) | ORAL | 0 refills | Status: AC
Start: 2018-02-08 — End: 2018-09-27

## 2018-02-08 MED ORDER — THIAMINE MONONITRATE (VIT B1) 100 MG PO TAB
100 mg | ORAL_TABLET | Freq: Every day | ORAL | 0 refills | 10.00000 days | Status: AC
Start: 2018-02-08 — End: 2018-09-27

## 2018-02-08 MED ORDER — LACTATED RINGERS IV SOLP
2000 mL | INTRAVENOUS | 0 refills | Status: CP
Start: 2018-02-08 — End: ?
  Administered 2018-02-09: 01:00:00 2000 mL via INTRAVENOUS

## 2018-02-08 MED ORDER — NALOXONE 4 MG/ACTUATION NA SPRY
.4 mg | NASAL | 1 refills | 1.00000 days | Status: AC | PRN
Start: 2018-02-08 — End: 2018-09-27
  Filled 2018-02-08: qty 1, 30d supply

## 2018-02-09 DIAGNOSIS — R000 Tachycardia, unspecified: ICD-10-CM

## 2018-02-09 DIAGNOSIS — F10229 Alcohol dependence with intoxication, unspecified: Principal | ICD-10-CM

## 2018-02-09 MED ADMIN — LORAZEPAM 2 MG/ML IJ SOLN [10467]: 2 mg | INTRAVENOUS | @ 01:00:00 | Stop: 2018-02-09 | NDC 00641604401

## 2018-02-14 ENCOUNTER — Encounter: Admit: 2018-02-14 | Discharge: 2018-02-14

## 2018-02-21 ENCOUNTER — Encounter: Admit: 2018-02-21 | Discharge: 2018-02-21

## 2018-02-24 ENCOUNTER — Encounter: Admit: 2018-02-24 | Discharge: 2018-02-24

## 2018-03-04 ENCOUNTER — Encounter: Admit: 2018-03-04 | Discharge: 2018-03-04

## 2018-03-04 DIAGNOSIS — R569 Unspecified convulsions: Principal | ICD-10-CM

## 2018-03-04 DIAGNOSIS — F4310 Post-traumatic stress disorder, unspecified: ICD-10-CM

## 2018-03-04 DIAGNOSIS — F329 Major depressive disorder, single episode, unspecified: ICD-10-CM

## 2018-03-04 DIAGNOSIS — F1020 Alcohol dependence, uncomplicated: ICD-10-CM

## 2018-03-04 DIAGNOSIS — N200 Calculus of kidney: ICD-10-CM

## 2018-03-04 DIAGNOSIS — K859 Acute pancreatitis without necrosis or infection, unspecified: ICD-10-CM

## 2018-03-04 DIAGNOSIS — M5126 Other intervertebral disc displacement, lumbar region: ICD-10-CM

## 2018-04-12 ENCOUNTER — Encounter: Admit: 2018-04-12 | Discharge: 2018-04-12

## 2018-04-29 ENCOUNTER — Encounter: Admit: 2018-04-29 | Discharge: 2018-04-29

## 2018-04-29 ENCOUNTER — Ambulatory Visit: Admit: 2018-04-29 | Discharge: 2018-04-29 | Payer: BC Managed Care – PPO

## 2018-04-29 DIAGNOSIS — M5126 Other intervertebral disc displacement, lumbar region: ICD-10-CM

## 2018-04-29 DIAGNOSIS — R569 Unspecified convulsions: Principal | ICD-10-CM

## 2018-04-29 DIAGNOSIS — K859 Acute pancreatitis without necrosis or infection, unspecified: ICD-10-CM

## 2018-04-29 DIAGNOSIS — F4310 Post-traumatic stress disorder, unspecified: ICD-10-CM

## 2018-04-29 DIAGNOSIS — N200 Calculus of kidney: ICD-10-CM

## 2018-04-29 DIAGNOSIS — R3129 Other microscopic hematuria: Principal | ICD-10-CM

## 2018-04-29 DIAGNOSIS — F1020 Alcohol dependence, uncomplicated: ICD-10-CM

## 2018-04-29 DIAGNOSIS — F329 Major depressive disorder, single episode, unspecified: ICD-10-CM

## 2018-08-02 ENCOUNTER — Encounter: Admit: 2018-08-02 | Discharge: 2018-08-02

## 2018-08-02 ENCOUNTER — Ambulatory Visit: Admit: 2018-08-02 | Discharge: 2018-08-03 | Payer: BC Managed Care – PPO

## 2018-08-02 DIAGNOSIS — F4310 Post-traumatic stress disorder, unspecified: ICD-10-CM

## 2018-08-02 DIAGNOSIS — R569 Unspecified convulsions: Principal | ICD-10-CM

## 2018-08-02 DIAGNOSIS — K859 Acute pancreatitis without necrosis or infection, unspecified: ICD-10-CM

## 2018-08-02 DIAGNOSIS — F1020 Alcohol dependence, uncomplicated: ICD-10-CM

## 2018-08-02 DIAGNOSIS — M5126 Other intervertebral disc displacement, lumbar region: ICD-10-CM

## 2018-08-02 DIAGNOSIS — F329 Major depressive disorder, single episode, unspecified: ICD-10-CM

## 2018-08-02 DIAGNOSIS — D1801 Hemangioma of skin and subcutaneous tissue: ICD-10-CM

## 2018-08-02 DIAGNOSIS — N200 Calculus of kidney: ICD-10-CM

## 2018-08-03 DIAGNOSIS — D489 Neoplasm of uncertain behavior, unspecified: ICD-10-CM

## 2018-08-03 DIAGNOSIS — D1809 Hemangioma of other sites: Principal | ICD-10-CM

## 2018-08-03 DIAGNOSIS — C44519 Basal cell carcinoma of skin of other part of trunk: ICD-10-CM

## 2018-08-03 DIAGNOSIS — D229 Melanocytic nevi, unspecified: ICD-10-CM

## 2018-08-16 ENCOUNTER — Ambulatory Visit: Admit: 2018-08-16 | Discharge: 2018-08-17 | Payer: BC Managed Care – PPO

## 2018-08-31 ENCOUNTER — Encounter: Admit: 2018-08-31 | Discharge: 2018-08-31

## 2018-08-31 ENCOUNTER — Ambulatory Visit: Admit: 2018-08-31 | Discharge: 2018-09-01 | Payer: BC Managed Care – PPO

## 2018-08-31 DIAGNOSIS — F329 Major depressive disorder, single episode, unspecified: ICD-10-CM

## 2018-08-31 DIAGNOSIS — M5126 Other intervertebral disc displacement, lumbar region: ICD-10-CM

## 2018-08-31 DIAGNOSIS — F4310 Post-traumatic stress disorder, unspecified: ICD-10-CM

## 2018-08-31 DIAGNOSIS — R569 Unspecified convulsions: Principal | ICD-10-CM

## 2018-08-31 DIAGNOSIS — F1020 Alcohol dependence, uncomplicated: ICD-10-CM

## 2018-08-31 DIAGNOSIS — N200 Calculus of kidney: ICD-10-CM

## 2018-08-31 DIAGNOSIS — K859 Acute pancreatitis without necrosis or infection, unspecified: ICD-10-CM

## 2018-09-01 DIAGNOSIS — C4491 Basal cell carcinoma of skin, unspecified: Principal | ICD-10-CM

## 2018-09-01 DIAGNOSIS — C44519 Basal cell carcinoma of skin of other part of trunk: ICD-10-CM

## 2018-09-12 ENCOUNTER — Ambulatory Visit: Admit: 2018-09-12 | Discharge: 2018-09-13 | Payer: BC Managed Care – PPO

## 2018-09-27 ENCOUNTER — Encounter: Admit: 2018-09-27 | Discharge: 2018-09-27

## 2018-09-27 ENCOUNTER — Ambulatory Visit: Admit: 2018-09-27 | Discharge: 2018-09-28 | Payer: BC Managed Care – PPO

## 2018-09-27 DIAGNOSIS — Z23 Encounter for immunization: ICD-10-CM

## 2018-09-27 DIAGNOSIS — R5383 Other fatigue: ICD-10-CM

## 2018-09-27 DIAGNOSIS — R569 Unspecified convulsions: Principal | ICD-10-CM

## 2018-09-27 DIAGNOSIS — F1021 Alcohol dependence, in remission: ICD-10-CM

## 2018-09-27 DIAGNOSIS — M79644 Pain in right finger(s): Principal | ICD-10-CM

## 2018-09-27 DIAGNOSIS — Z13220 Encounter for screening for lipoid disorders: ICD-10-CM

## 2018-09-27 DIAGNOSIS — F902 Attention-deficit hyperactivity disorder, combined type: ICD-10-CM

## 2018-09-27 DIAGNOSIS — M5126 Other intervertebral disc displacement, lumbar region: ICD-10-CM

## 2018-09-27 DIAGNOSIS — F329 Major depressive disorder, single episode, unspecified: ICD-10-CM

## 2018-09-27 DIAGNOSIS — I10 Essential (primary) hypertension: Principal | ICD-10-CM

## 2018-09-27 DIAGNOSIS — F1020 Alcohol dependence, uncomplicated: ICD-10-CM

## 2018-09-27 DIAGNOSIS — N200 Calculus of kidney: ICD-10-CM

## 2018-09-27 DIAGNOSIS — K859 Acute pancreatitis without necrosis or infection, unspecified: ICD-10-CM

## 2018-09-27 DIAGNOSIS — M65311 Trigger thumb, right thumb: ICD-10-CM

## 2018-09-27 DIAGNOSIS — F419 Anxiety disorder, unspecified: ICD-10-CM

## 2018-09-27 DIAGNOSIS — F4310 Post-traumatic stress disorder, unspecified: ICD-10-CM

## 2018-09-27 DIAGNOSIS — Z7689 Persons encountering health services in other specified circumstances: ICD-10-CM

## 2018-09-27 MED ORDER — LISINOPRIL 10 MG PO TAB
10 mg | ORAL_TABLET | Freq: Every day | ORAL | 1 refills | Status: AC
Start: 2018-09-27 — End: 2018-11-15
  Filled 2018-09-30 (×2): qty 90, 90d supply, fill #1

## 2018-09-28 ENCOUNTER — Encounter: Admit: 2018-09-28 | Discharge: 2018-09-28

## 2018-09-28 LAB — CBC AND DIFF
Lab: 12 % (ref 11.0–15.0)
Lab: 34 g/dL (ref 32.0–36.0)
Lab: 4.8 10*3/uL (ref <150)
Lab: 9.5 fL (ref 7.5–12.5)

## 2018-09-28 LAB — LIPID PROFILE: Lab: 181 mg/dL (ref <200)

## 2018-09-28 LAB — VITAMIN B12: Lab: 435 pg/mL — ABNORMAL LOW (ref 200–1100)

## 2018-09-28 LAB — COMPREHENSIVE METABOLIC PANEL: Lab: 91 mg/dL (ref >40)

## 2018-09-28 LAB — TSH WITH FREE T4 REFLEX: Lab: 2 m[IU]/L — ABNORMAL LOW (ref >=60)

## 2018-09-29 ENCOUNTER — Encounter: Admit: 2018-09-29 | Discharge: 2018-09-29

## 2018-09-30 ENCOUNTER — Encounter: Admit: 2018-09-30 | Discharge: 2018-09-30

## 2018-10-03 ENCOUNTER — Encounter: Admit: 2018-10-03 | Discharge: 2018-10-03

## 2018-10-04 ENCOUNTER — Encounter: Admit: 2018-10-04 | Discharge: 2018-10-04

## 2018-10-05 ENCOUNTER — Encounter: Admit: 2018-10-05 | Discharge: 2018-10-05

## 2018-10-06 ENCOUNTER — Encounter: Admit: 2018-10-06 | Discharge: 2018-10-06

## 2018-10-06 DIAGNOSIS — F902 Attention-deficit hyperactivity disorder, combined type: Principal | ICD-10-CM

## 2018-10-06 MED ORDER — DEXTROAMPHETAMINE-AMPHETAMINE 10 MG PO CP24
10 mg | ORAL_CAPSULE | Freq: Every morning | ORAL | 0 refills | Status: AC
Start: 2018-10-06 — End: 2018-10-17

## 2018-10-10 ENCOUNTER — Encounter: Admit: 2018-10-10 | Discharge: 2018-10-10

## 2018-10-11 ENCOUNTER — Encounter: Admit: 2018-10-11 | Discharge: 2018-10-11

## 2018-10-13 ENCOUNTER — Encounter: Admit: 2018-10-13 | Discharge: 2018-10-13

## 2018-10-17 ENCOUNTER — Encounter: Admit: 2018-10-17 | Discharge: 2018-10-17

## 2018-10-17 ENCOUNTER — Ambulatory Visit: Admit: 2018-10-17 | Discharge: 2018-10-18 | Payer: BC Managed Care – PPO

## 2018-10-17 DIAGNOSIS — M5126 Other intervertebral disc displacement, lumbar region: ICD-10-CM

## 2018-10-17 DIAGNOSIS — R569 Unspecified convulsions: Principal | ICD-10-CM

## 2018-10-17 DIAGNOSIS — I10 Essential (primary) hypertension: ICD-10-CM

## 2018-10-17 DIAGNOSIS — K859 Acute pancreatitis without necrosis or infection, unspecified: ICD-10-CM

## 2018-10-17 DIAGNOSIS — N200 Calculus of kidney: ICD-10-CM

## 2018-10-17 DIAGNOSIS — F902 Attention-deficit hyperactivity disorder, combined type: ICD-10-CM

## 2018-10-17 DIAGNOSIS — F4310 Post-traumatic stress disorder, unspecified: ICD-10-CM

## 2018-10-17 DIAGNOSIS — F419 Anxiety disorder, unspecified: Principal | ICD-10-CM

## 2018-10-17 DIAGNOSIS — F329 Major depressive disorder, single episode, unspecified: ICD-10-CM

## 2018-10-17 DIAGNOSIS — F320 Major depressive disorder, single episode, mild: ICD-10-CM

## 2018-10-17 DIAGNOSIS — F1020 Alcohol dependence, uncomplicated: ICD-10-CM

## 2018-10-17 MED ORDER — ESCITALOPRAM OXALATE 10 MG PO TAB
10 mg | ORAL_TABLET | Freq: Every day | ORAL | 1 refills | Status: AC
Start: 2018-10-17 — End: 2018-11-15

## 2018-10-17 MED ORDER — DEXTROAMPHETAMINE-AMPHETAMINE 20 MG PO CP24
20 mg | ORAL_CAPSULE | Freq: Every morning | ORAL | 0 refills | Status: AC
Start: 2018-10-17 — End: 2018-11-15

## 2018-10-25 ENCOUNTER — Encounter: Admit: 2018-10-25 | Discharge: 2018-10-25

## 2018-11-10 ENCOUNTER — Encounter: Admit: 2018-11-10 | Discharge: 2018-11-10

## 2018-11-15 ENCOUNTER — Ambulatory Visit: Admit: 2018-11-15 | Discharge: 2018-11-16 | Payer: BC Managed Care – PPO

## 2018-11-15 ENCOUNTER — Encounter: Admit: 2018-11-15 | Discharge: 2018-11-15

## 2018-11-15 DIAGNOSIS — F329 Major depressive disorder, single episode, unspecified: ICD-10-CM

## 2018-11-15 DIAGNOSIS — I10 Essential (primary) hypertension: ICD-10-CM

## 2018-11-15 DIAGNOSIS — M5126 Other intervertebral disc displacement, lumbar region: ICD-10-CM

## 2018-11-15 DIAGNOSIS — N200 Calculus of kidney: ICD-10-CM

## 2018-11-15 DIAGNOSIS — F419 Anxiety disorder, unspecified: ICD-10-CM

## 2018-11-15 DIAGNOSIS — K859 Acute pancreatitis without necrosis or infection, unspecified: ICD-10-CM

## 2018-11-15 DIAGNOSIS — R569 Unspecified convulsions: Principal | ICD-10-CM

## 2018-11-15 DIAGNOSIS — F4310 Post-traumatic stress disorder, unspecified: ICD-10-CM

## 2018-11-15 DIAGNOSIS — F902 Attention-deficit hyperactivity disorder, combined type: Principal | ICD-10-CM

## 2018-11-15 DIAGNOSIS — F1020 Alcohol dependence, uncomplicated: ICD-10-CM

## 2018-11-15 MED ORDER — DEXTROAMPHETAMINE-AMPHETAMINE 20 MG PO CP24
20 mg | ORAL_CAPSULE | Freq: Every morning | ORAL | 0 refills | Status: AC
Start: 2018-11-15 — End: 2018-12-14

## 2018-11-15 MED ORDER — ESCITALOPRAM OXALATE 10 MG PO TAB
10 mg | ORAL_TABLET | Freq: Every day | ORAL | 1 refills | Status: AC
Start: 2018-11-15 — End: 2020-06-10

## 2018-11-15 MED ORDER — LISINOPRIL 10 MG PO TAB
10 mg | ORAL_TABLET | Freq: Every day | ORAL | 1 refills | Status: AC
Start: 2018-11-15 — End: 2020-03-26

## 2018-11-24 ENCOUNTER — Encounter: Admit: 2018-11-24 | Discharge: 2018-11-24

## 2018-11-29 ENCOUNTER — Encounter: Admit: 2018-11-29 | Discharge: 2018-11-29

## 2018-12-14 ENCOUNTER — Encounter: Admit: 2018-12-14 | Discharge: 2018-12-14

## 2018-12-14 DIAGNOSIS — F902 Attention-deficit hyperactivity disorder, combined type: Secondary | ICD-10-CM

## 2018-12-14 MED ORDER — DEXTROAMPHETAMINE-AMPHETAMINE 20 MG PO CP24
20 mg | ORAL_CAPSULE | Freq: Every morning | ORAL | 0 refills | Status: AC
Start: 2018-12-14 — End: 2019-01-13

## 2018-12-15 ENCOUNTER — Encounter: Admit: 2018-12-15 | Discharge: 2018-12-15

## 2019-01-13 ENCOUNTER — Encounter: Admit: 2019-01-13 | Discharge: 2019-01-13

## 2019-01-13 DIAGNOSIS — F902 Attention-deficit hyperactivity disorder, combined type: Principal | ICD-10-CM

## 2019-01-13 MED ORDER — DEXTROAMPHETAMINE-AMPHETAMINE 20 MG PO CP24
20 mg | ORAL_CAPSULE | Freq: Every morning | ORAL | 0 refills | Status: AC
Start: 2019-01-13 — End: 2019-02-09

## 2019-01-13 NOTE — Progress Notes
Pt was needing a refill on this ADD medication.  The medication was sent to the [facility] off [address] for the pt to pick up.

## 2019-01-26 ENCOUNTER — Encounter: Admit: 2019-01-26 | Discharge: 2019-01-26

## 2019-02-09 ENCOUNTER — Encounter: Admit: 2019-02-09 | Discharge: 2019-02-09

## 2019-02-09 DIAGNOSIS — F902 Attention-deficit hyperactivity disorder, combined type: Principal | ICD-10-CM

## 2019-02-09 MED ORDER — DEXTROAMPHETAMINE-AMPHETAMINE 20 MG PO CP24
20 mg | ORAL_CAPSULE | Freq: Every morning | ORAL | 0 refills | Status: AC
Start: 2019-02-09 — End: 2020-03-26

## 2019-02-20 NOTE — Progress Notes
He can reschedule next month

## 2019-02-21 ENCOUNTER — Encounter: Admit: 2019-02-21 | Discharge: 2019-02-21

## 2019-03-05 ENCOUNTER — Encounter: Admit: 2019-03-05 | Discharge: 2019-03-05

## 2019-03-05 ENCOUNTER — Emergency Department: Admit: 2019-03-05 | Discharge: 2019-03-06 | Disposition: A | Discharge status: Home / Self Care

## 2019-03-05 ENCOUNTER — Emergency Department: Admit: 2019-03-05 | Discharge: 2019-03-06

## 2019-03-05 LAB — MAGNESIUM: Lab: 2.1 mg/dL — ABNORMAL HIGH (ref 1.6–2.6)

## 2019-03-05 LAB — URINALYSIS DIPSTICK: Lab: NEGATIVE mg/dL — ABNORMAL LOW (ref 0.3–1.2)

## 2019-03-05 LAB — CBC AND DIFF
Lab: 0 10*3/uL (ref 0–0.20)
Lab: 0 10*3/uL (ref 0–0.45)

## 2019-03-05 LAB — COMPREHENSIVE METABOLIC PANEL
Lab: >60 mL/min (ref >60)
Lab: >60 mL/min (ref >60)

## 2019-03-05 MED ORDER — LACTATED RINGERS IV SOLP
1000 mL | Freq: Once | INTRAVENOUS | 0 refills | Status: CP
Start: 2019-03-05 — End: ?
  Administered 2019-03-05: 20:00:00 1000 mL via INTRAVENOUS

## 2019-03-05 MED ORDER — THIAMINE/FOLIC ACID IVPB
Freq: Once | INTRAVENOUS | 0 refills | Status: CP
Start: 2019-03-05 — End: ?
  Administered 2019-03-05 (×3): 50.000 mL via INTRAVENOUS

## 2019-03-05 NOTE — ED Notes
Pt presents to ED 21 for evaluation after alcohol intoxication. Pt states he had approx 18 beers today. When asked if this is normal pt states it is when I'm in pain. Pt endorses upper abdominal pain. Pt has hx of pancreatitis but states this pain feels different. Pt states pain gets worse when he urinates, but states pain is different from the time he had kidney stones. When asked to described pain pt states I don't know it just hurts. Pts abdomen is soft, tender to touch, when Dr. Wilson Singer palpated pts abdomen pt swatted at her. Pt informed this behavior is not acceptable, pt apologized. When asked questions for triage pt started making inappropriate comments, again informed his behavior was not acceptable. Pt A&Ox4, VSS, breaths even, non labored, abdomen soft, tender to palpation in upper quadrants. Pt placed on cardiac, BP, SpO2 monitors at this time.    Belongings:    Hat, boxers, Transport planner, tennis shoes, grey zip up sweat shirt, ID and credit cards wrapped in rubber band

## 2019-03-06 DIAGNOSIS — F10920 Alcohol use, unspecified with intoxication, uncomplicated: Principal | ICD-10-CM

## 2019-03-06 NOTE — ED Notes
Discussed discharge paperwork with pt, pt verbalizes understanding. PIV removed. SW to call cab for ride home. Pt ambulates off unit with steady gait, AOx4, breathing appears even and non-labored. All paperwork and belongings in pt possession.

## 2019-03-06 NOTE — Case Management (ED)
Case Management Progress Note    NAME:Daniel French                          MRN: [id number]              DOB:June 07, 1971          AGE: 48 y.o.  ADMISSION DATE: 03/05/2019             DAYS ADMITTED: LOS: 0 days      Today???s Date: 03/05/2019    Plan: Pt discharge by paying for a cab himself.     Interventions  ? Support      ? Info or Referral      ? Discharge Planning   Discharge Planning: Transportation Arrangements and/or Resources   ??? Baird Lyons, Charity fundraiser, contacted SW to assist pt with transportation at home and pt's wife not answering.  ??? SW reviewed pt's EMR, pt does NOT have active Medicaid as a transportation benefit. SW presented to pt's bedside. SW introduced self, explained role and inquired if pt had called his wife and/or sponsor for transportation.  ??? Pt stated she's mad and knows I'm hear. Pt stated I'm not going to call her and embarrass her. Pt said he has his wallet on him and showed SW. Pt confirmed address is [address] [place], [place] [address].   ??? SW agreed to call cab on pt's behalf but pt has the financial means to pay for his cab. SW updated RN pt can dc to [facility] and SW will call cab.   ? Medication Needs      ?  Financial      ? Legal      ? Other      Disposition  ? Expected Discharge Date       ? Transportation      ? Next Level of Care (Acute Psych discharges only)      ? Discharge Disposition     Durable Medical Equipment      No service has been selected for the patient.      [place] Destination      No service has been selected for the patient.      [facility]      No service has been selected for the patient.       Dialysis/Infusion      No service has been selected for the patient.        [employer] SW  Pager [phone]  Desk [phone]

## 2019-03-07 ENCOUNTER — Encounter: Admit: 2019-03-07 | Discharge: 2019-03-07

## 2019-03-07 NOTE — Telephone Encounter
LCSW placed call to pt after his recent ED visit to check on his well being and access to resources,  L/m.

## 2019-03-09 ENCOUNTER — Encounter: Admit: 2019-03-09 | Discharge: 2019-03-09

## 2019-06-30 ENCOUNTER — Encounter: Admit: 2019-06-30 | Discharge: 2019-06-30

## 2019-08-23 ENCOUNTER — Encounter: Admit: 2019-08-23 | Discharge: 2019-08-23

## 2019-08-24 ENCOUNTER — Encounter: Admit: 2019-08-24 | Discharge: 2019-08-24

## 2019-08-30 ENCOUNTER — Encounter: Admit: 2019-08-30 | Discharge: 2019-08-30

## 2020-03-26 MED ORDER — LISINOPRIL 10 MG PO TAB
10 mg | ORAL_TABLET | Freq: Every day | ORAL | 1 refills | Status: DC
Start: 2020-03-26 — End: 2020-06-12

## 2020-03-26 MED ORDER — DEXTROAMPHETAMINE-AMPHETAMINE 20 MG PO CP24
20 mg | ORAL_CAPSULE | Freq: Every morning | ORAL | 0 refills | Status: DC
Start: 2020-03-26 — End: 2020-04-30

## 2020-03-28 MED ORDER — ATORVASTATIN 10 MG PO TAB
10 mg | ORAL_TABLET | Freq: Every day | ORAL | 1 refills | Status: DC
Start: 2020-03-28 — End: 2020-06-23

## 2020-04-16 ENCOUNTER — Emergency Department: Admit: 2020-04-16 | Discharge: 2020-04-16

## 2020-04-16 ENCOUNTER — Encounter: Admit: 2020-04-16 | Discharge: 2020-04-16

## 2020-04-16 DIAGNOSIS — F329 Major depressive disorder, single episode, unspecified: Secondary | ICD-10-CM

## 2020-04-16 DIAGNOSIS — N200 Calculus of kidney: Secondary | ICD-10-CM

## 2020-04-16 DIAGNOSIS — F1020 Alcohol dependence, uncomplicated: Secondary | ICD-10-CM

## 2020-04-16 DIAGNOSIS — F4310 Post-traumatic stress disorder, unspecified: Secondary | ICD-10-CM

## 2020-04-16 DIAGNOSIS — M5126 Other intervertebral disc displacement, lumbar region: Secondary | ICD-10-CM

## 2020-04-16 DIAGNOSIS — K859 Acute pancreatitis without necrosis or infection, unspecified: Secondary | ICD-10-CM

## 2020-04-16 DIAGNOSIS — R569 Unspecified convulsions: Secondary | ICD-10-CM

## 2020-04-16 LAB — CBC AND DIFF
Lab: 0 % (ref >60)
Lab: 0 10*3/uL (ref 0–0.20)
Lab: 0 10*3/uL (ref 0–0.45)
Lab: 0.4 10*3/uL (ref 0–0.80)
Lab: 1 % — ABNORMAL HIGH (ref 0–5)
Lab: 12 % (ref 11–15)
Lab: 12 g/dL — ABNORMAL LOW (ref 13.5–16.5)
Lab: 17 (ref <20.7)
Lab: 2.1 10*3/uL (ref 1.0–4.8)
Lab: 3.6 10*3/uL (ref >60)
Lab: 305 10*3/uL (ref 150–400)
Lab: 31 pg — ABNORMAL LOW (ref 26–34)
Lab: 34 % (ref 24–44)
Lab: 34 g/dL (ref 32.0–36.0)
Lab: 36 % — ABNORMAL LOW (ref 40–50)
Lab: 4 M/UL — ABNORMAL LOW (ref 4.4–5.5)
Lab: 58 % (ref 41–77)
Lab: 6.3 10*3/uL (ref 4.5–11.0)
Lab: 6.9 FL — ABNORMAL LOW (ref 7–11)
Lab: 7 % (ref 4–12)
Lab: 89 FL (ref 80–100)

## 2020-04-16 LAB — URINALYSIS MICROSCOPIC REFLEX TO CULTURE

## 2020-04-16 LAB — URINALYSIS DIPSTICK REFLEX TO CULTURE
Lab: NEGATIVE
Lab: NEGATIVE
Lab: NEGATIVE
Lab: NEGATIVE
Lab: NEGATIVE
Lab: NEGATIVE

## 2020-04-16 LAB — LIPASE: Lab: 138 U/L — ABNORMAL HIGH (ref 11–82)

## 2020-04-16 LAB — COMPREHENSIVE METABOLIC PANEL: Lab: 146 MMOL/L (ref 137–147)

## 2020-04-16 LAB — POC TROPONIN: Lab: 0 ng/mL — ABNORMAL LOW (ref 0.00–0.05)

## 2020-04-16 MED ORDER — LACTATED RINGERS IV SOLP
1000 mL | Freq: Once | INTRAVENOUS | 0 refills | Status: CP
Start: 2020-04-16 — End: ?
  Administered 2020-04-16: 13:00:00 1000 mL via INTRAVENOUS

## 2020-04-16 MED ORDER — SODIUM CHLORIDE 0.9 % IJ SOLN
50 mL | Freq: Once | INTRAVENOUS | 0 refills | Status: CP
Start: 2020-04-16 — End: ?
  Administered 2020-04-16: 13:00:00 50 mL via INTRAVENOUS

## 2020-04-16 MED ORDER — ACETAMINOPHEN 325 MG PO TAB
650 mg | Freq: Once | ORAL | 0 refills | Status: CP
Start: 2020-04-16 — End: ?
  Administered 2020-04-16: 13:00:00 650 mg via ORAL

## 2020-04-16 MED ORDER — IOHEXOL 350 MG IODINE/ML IV SOLN
100 mL | Freq: Once | INTRAVENOUS | 0 refills | Status: CP
Start: 2020-04-16 — End: ?
  Administered 2020-04-16: 13:00:00 100 mL via INTRAVENOUS

## 2020-04-16 NOTE — ED Provider Notes
Daniel French is a 49 y.o. male.    Chief Complaint:  Chief Complaint   Patient presents with   ? Abdominal pain   ? Back Pain       History of Present Illness:  49 year old male with a history of hypertension, EtOH abuse, PTSD , Alcohol withdrawal seizure presenting to the emergency department with a 3-day history of abdominal pain.  States that has been stable over the previous 3 days, not progressive.  It is left lateral chest/abdomen in location, 4/10 in severity, described as sharp.  He denies alleviating factors, has not taken any medication for his pain.  It is worse with palpation and movement.  States he has not experienced pain like this in the past.  He denies any fevers, chills, nausea, vomiting, other chest pain, constipation, diarrhea.  Of note patient states he recently relapsed on alcohol approximately 2 weeks ago, last use was this morning.  Endorses considerable fireball use per day.          Review of Systems:  Review of Systems   Constitutional: Negative for chills, fatigue and fever.   HENT: Negative for sore throat.    Eyes: Negative for visual disturbance.   Respiratory: Negative for shortness of breath.    Cardiovascular: Positive for chest pain. Negative for palpitations.   Gastrointestinal: Positive for abdominal pain. Negative for nausea and vomiting.   Genitourinary: Negative for dysuria.   Musculoskeletal: Negative for back pain.   Skin: Negative for rash.   Neurological: Negative for weakness and headaches.   Hematological: Does not bruise/bleed easily.   Psychiatric/Behavioral: Negative for confusion and suicidal ideas.       Allergies:  Patient has no known allergies.    Past Medical History:  Medical History:   Diagnosis Date   ? Alcoholism (HCC)    ? Depression    ? Herniation of lumbar intervertebral disc    ? Nephrolithiasis    ? Pancreatitis    ? PTSD (post-traumatic stress disorder)    ? Seizure [facility])        Past Surgical History:  Surgical History:   Procedure Laterality Date   ? HX APPENDECTOMY     ? KIDNEY STONE SURGERY         Pertinent medical and surgical history reviewed    Social History:  Social History     Tobacco Use   ? Smoking status: Never Smoker   ? Smokeless tobacco: Current User     Types: Chew   Substance Use Topics   ? Alcohol use: Yes     Alcohol/week: 0.0 standard drinks     Comment: hard liquor binge drinking (sober for 4 years and recur, drink vodka x 1 month)   ? Drug use: No     Social History     Substance and Sexual Activity   Drug Use No             Family History:  Family History   Problem Relation Age of Onset   ? Heart Attack Other    ? Hypertension Other    ? None Reported Mother    ? Hypertension Father    ? Cancer Father    ? None Reported Sister    ? None Reported Brother    ? None Reported Sister    ? None Reported Brother        Vitals:  ED Vitals    Date and Time T BP P RR SPO2P SPO2 User  04/16/20 0845 -- -- -- 14 PER MINUTE 75 99 % MB   04/16/20 0753 -- -- -- -- 73 94 % MB   04/16/20 0739 -- 126/80 -- 15 PER MINUTE 66 96 % MB   04/16/20 0710 -- -- 72 -- 72 97 % MB   04/16/20 0700 -- 139/109 84 -- -- -- DC   04/16/20 0503 36.5 ?C (97.7 ?F) 148/98 75 20 PER MINUTE 75 100 % DC          Physical Exam:  Physical Exam  Vitals and nursing note reviewed.   Constitutional:       General: He is not in acute distress.     Appearance: Normal appearance. He is normal weight. He is not ill-appearing or diaphoretic.   HENT:      Head: Normocephalic and atraumatic.      Nose: Nose normal. No congestion.      Mouth/Throat:      Mouth: Mucous membranes are moist.      Pharynx: Oropharynx is clear. No oropharyngeal exudate.   Eyes:      Extraocular Movements: Extraocular movements intact.      Conjunctiva/sclera: Conjunctivae normal.   Cardiovascular:      Rate and Rhythm: Normal rate and regular rhythm.      Pulses: Normal pulses.      Heart sounds: Normal heart sounds. No murmur.   Pulmonary:      Effort: Pulmonary effort is normal.      Breath sounds: Normal breath sounds. No stridor. No wheezing, rhonchi or rales.   Chest:      Chest wall: Tenderness (Tenderness to palpation over the left lateral and anterior chest, approximately ribs 6 through 10.  No overlying skin changes, ecchymosis, obvious deformities.) present.   Abdominal:      General: Bowel sounds are normal. There is no distension.      Palpations: Abdomen is soft.      Tenderness: There is no abdominal tenderness. There is no guarding or rebound.   Musculoskeletal:         General: Normal range of motion.      Cervical back: Neck supple.      Right lower leg: No edema.      Left lower leg: No edema.   Skin:     General: Skin is warm and dry.      Capillary Refill: Capillary refill takes less than 2 seconds.      Findings: No rash.   Neurological:      Mental Status: He is disoriented.      Motor: No weakness.      Gait: Gait normal.   Psychiatric:         Mood and Affect: Mood normal.      Comments: Patient mildly confused, mildly disoriented.  Consistent with recent alcohol use.         Laboratory Results:  Labs Reviewed   URINALYSIS DIPSTICK REFLEX TO CULTURE - Abnormal       Result Value Ref Range Status    Color,UA YELLOW   Final    Turbidity,UA CLEAR  CLEAR-CLEAR Final    Specific Gravity-Urine 1.010  1.003 - 1.035 Final    pH,UA 7.0  5.0 - 8.0 Final    Protein,UA NEG  NEG-NEG Final    Glucose,UA NEG  NEG-NEG Final    Ketones,UA NEG  NEG-NEG Final    Bilirubin,UA NEG  NEG-NEG Final    Blood,UA 1+ (*) NEG-NEG Final  Urobilinogen,UA NORMAL  NORM-NORMAL Final    Nitrite,UA NEG  NEG-NEG Final    Leukocytes,UA NEG  NEG-NEG Final    Urine Ascorbic Acid, UA NEG  NEG-NEG Final   LIPASE - Abnormal    Lipase 138 (*) 11 - 82 U/L Final   COMPREHENSIVE METABOLIC PANEL - Abnormal    Sodium 146  137 - 147 MMOL/L Final    Potassium 3.5  3.5 - 5.1 MMOL/L Final    Chloride 108  98 - 110 MMOL/L Final    Glucose 125 (*) 70 - 100 MG/DL Final    Blood Urea Nitrogen 8  7 - 25 MG/DL Final    Creatinine 1.61 0.4 - 1.24 MG/DL Final    Calcium 8.1 (*) 8.5 - 10.6 MG/DL Final    Total Protein 6.5  6.0 - 8.0 G/DL Final    Total Bilirubin 0.6  0.3 - 1.2 MG/DL Final    Albumin 3.9  3.5 - 5.0 G/DL Final    Alk Phosphatase 89  25 - 110 U/L Final    AST (SGOT) 31  7 - 40 U/L Final    CO2 25  21 - 30 MMOL/L Final    ALT (SGPT) 49  7 - 56 U/L Final    Anion Gap 13 (*) 3 - 12 Final    eGFR Non African American >60  >60 mL/min Final    eGFR African American >60  >60 mL/min Final   CBC AND DIFF - Abnormal    White Blood Cells 6.3  4.5 - 11.0 K/UL Final    RBC 4.03 (*) 4.4 - 5.5 M/UL Final    Hemoglobin 12.6 (*) 13.5 - 16.5 GM/DL Final    Hematocrit 09.6 (*) 40 - 50 % Final    MCV 89.6  80 - 100 FL Final    MCH 31.2  26 - 34 PG Final    MCHC 34.8  32.0 - 36.0 G/DL Final    RDW 04.5  11 - 15 % Final    Platelet Count 305  150 - 400 K/UL Final    MPV 6.9 (*) 7 - 11 FL Final    Neutrophils 58  41 - 77 % Final    Lymphocytes 34  24 - 44 % Final    Monocytes 7  4 - 12 % Final    Eosinophils 1  0 - 5 % Final    Basophils 0  0 - 2 % Final    Absolute Neutrophil Count 3.63  1.8 - 7.0 K/UL Final    Absolute Lymph Count 2.15  1.0 - 4.8 K/UL Final    Absolute Monocyte Count 0.43  0 - 0.80 K/UL Final    Absolute Eosinophil Count 0.08  0 - 0.45 K/UL Final    Absolute Basophil Count 0.02  0 - 0.20 K/UL Final    MDW (Monocyte Distribution Width) 17.1  <20.7 Final   URINALYSIS MICROSCOPIC REFLEX TO CULTURE    WBCs,UA 0-2  0 - 2 /HPF Final    RBCs,UA 0-2  0 - 3 /HPF Final    Comment,UA     Final    Value: Criteria for reflex to culture are WBC>10, Positive Nitrite, and/or >=+1   leukocytes. If quantity is not sufficient, an addendum will follow.      MucousUA TRACE   Final   POC TROPONIN    Troponin-I-POC 0.00  0.00 - 0.05 NG/ML Final   UA REFLEX LABEL  Radiology Interpretation:    RIBS UNI LEFT W CHEST MIN 3V   Final Result         No acute cardiopulmonary abnormalities.      No evidence of displaced rib fracture.          Finalized by Darel Hong, M.D. on 04/16/2020 9:53 AM. Dictated by Darel Hong, M.D. on 04/16/2020 9:50 AM.         CT ABD/PELV W CONTRAST   Final Result      1.  No bowel obstruction, ascites or focal inflammatory mass.   2.  Tiny nonobstructing right renal calculus.   3.  Small left pleural effusion and associated atelectasis.          Finalized by Judie Petit, M.D. on 04/16/2020 7:51 AM. Dictated by Judie Petit, M.D. on 04/16/2020 7:41 AM.               EKG:    Rate: 73  Rhythm: Sinus  QTc: 435  Interpretation:  Regular rate, regular rhythm, normal axis, no evidence of acute ST or T wave changes.     ED Course:  49 y.o. male presented to the ED for chest pain/abdominal pain.  - Patient was evaluated by resident and attending physicians.  - Available records were reviewed.  - Vitals: Mild hypertension upon arrival that resolved spontaneously., Other VS are wnl.  - Initial concern was for, but not limited to: ACS, arrhythmia, pneumonia, pneumothorax, rib fracture or trauma, pancreatitis or other intra-abdominal etiology..  - Imaging: Chest x-ray and three-view ribs negative for acute abnormalities.  -CT and pelvis with tiny nonobstructing right renal calculus, otherwise within normal limits.  -He was given Tylenol, fluids due to appearance of dehydration on exam.  -Patient's CBC with a mild anemia 12.6, CMP within normal limits.  -Lipase mildly elevated at 138.  -Urinalysis was without evidence of infection.  Troponin also negative.  In the setting of ongoing pain over the previous 4 days additional troponin levels were not needed.  Patient's heart score = 2 low risk cardiology follow-up evaluation was ordered.    - Findings and plan for disposition discussed with patient. Patient understands and agrees with plan.   Dispo: Discharged home           ED Scoring:                           MDM  Reviewed: previous chart, nursing note and vitals  Interpretation: labs, ECG, x-ray and CT scan        Facility Administered Meds:  Medications acetaminophen (TYLENOL) tablet 650 mg (650 mg Oral Given 04/16/20 0740)   lactated ringers infusion (0 mL Intravenous Infusion Stopped 04/16/20 0842)   iohexoL (OMNIPAQUE-350) 350 mg/mL injection 100 mL (100 mL Intravenous Given 04/16/20 0730)   sodium chloride PF 0.9% injection 50 mL (50 mL Intravenous Given 04/16/20 0730)         Clinical Impression:  Clinical Impression   Acute pancreatitis, unspecified complication status, unspecified pancreatitis type       Disposition/Follow up  ED Disposition     ED Disposition    Discharge        Family Medicine: [facility]  [address]  [address]  [place] [address]  [phone]  Call   As needed    Verne Spurr, Virgina Jock, MD  [address]  [place] [address]  [phone]  Call   As needed      Medications:  Discharge Medication List as of 04/16/2020  8:48 AM          Procedure Notes:  Procedures      Attestation / Supervision:      Lucius Conn MD  Emergency Medicine PGY-2

## 2020-04-16 NOTE — Case Management (ED)
ED Social Worker contacted by [employer], RN, requesting to assist patient with d/c home.  It was reported that patient's address is [address], [place].  Per Maddy, patient is not appropriate for the bus, as he is still somewhat intoxicated.  Cab voucher provided to patient as patient does not have active Medicaid and states limited resources and supports available to assist with d/c.  Cab voucher completed and provided to Triage RN for patients d/c home.  Patient instructed to wait in the ED lobby for cab to arrive.    Eulas Post, LMSW, LCSW  Overnight ED Social Worker  [phone]

## 2020-04-16 NOTE — ED Notes
Report to Maddy RN.

## 2020-04-16 NOTE — Telephone Encounter
Hospital Discharge Follow Up      Reached Patient: no, per protocol, no TOC call made as patient has returned to the ED today. Will attempt TOC once discharged.      Admission Information:     Hospital Name: [facility]  Admission Date: 04/14/20  Discharge Date: 04/15/20  Admission Diagnosis: alcohol abuse, alcohol withdrawal  Discharge Diagnosis: alcohol abuse, alcohol withdrwawal, left AMA  Has there been a discharge within the last 30 days? No  If yes, reason: N/A  Hospital Services: Unplanned  Today's call is 1(business) days post discharge      Scheduling Follow-up Appointment   Upcoming appointment date and time and with whom scheduled: No future appointments.  PCP appointment scheduled?No, no TOC call made  PCP primary location: [facility]  Specialist appointment scheduled? No  Both PCP and Specialist appointment scheduled: No  MyChart message sent? Active in MyChart. No message sent.     Shelah Lewandowsky, RN

## 2020-04-16 NOTE — ED Notes
Patient requesting to leave- Dr Sinclair Grooms and Dr Manson Passey notified, pt updated.

## 2020-04-16 NOTE — ED Notes
49 y/o male admitted to the ED from residence, with ETOH present. Patient admits to unknown amount of alcohol. Placed to bed and already trying to get up and leave. Patient is a safety risk, have CO outside of door. States, he has to get home to his two 49 y/o twins. Complaining of upper left abdominal pain, states, it is probably his pancreas. Admits to numerous falls and recently seen at either [facility] or [facility]. Oriented to self, does not recall which hospital he is at. No one with patient . No wounds seen on patient. Tender left upper quad.     Belongings: top and jeans, shoes and socks. Cell phone.

## 2020-04-22 ENCOUNTER — Encounter: Admit: 2020-04-22 | Discharge: 2020-04-22

## 2020-04-22 DIAGNOSIS — F1020 Alcohol dependence, uncomplicated: Secondary | ICD-10-CM

## 2020-04-22 DIAGNOSIS — N200 Calculus of kidney: Secondary | ICD-10-CM

## 2020-04-22 DIAGNOSIS — K859 Acute pancreatitis without necrosis or infection, unspecified: Secondary | ICD-10-CM

## 2020-04-22 DIAGNOSIS — F329 Major depressive disorder, single episode, unspecified: Secondary | ICD-10-CM

## 2020-04-22 DIAGNOSIS — R569 Unspecified convulsions: Secondary | ICD-10-CM

## 2020-04-22 DIAGNOSIS — M5126 Other intervertebral disc displacement, lumbar region: Secondary | ICD-10-CM

## 2020-04-22 DIAGNOSIS — F4310 Post-traumatic stress disorder, unspecified: Secondary | ICD-10-CM

## 2020-04-22 MED ORDER — LORAZEPAM 1 MG PO TAB
1 mg | Freq: Once | ORAL | 0 refills | Status: DC
Start: 2020-04-22 — End: 2020-04-23

## 2020-04-22 MED ORDER — HALOPERIDOL LACTATE 5 MG/ML IJ SOLN
5 mg | Freq: Once | INTRAMUSCULAR | 0 refills | Status: CP
Start: 2020-04-22 — End: ?

## 2020-04-22 MED ORDER — LORAZEPAM 2 MG/ML IJ SOLN
2 mg | Freq: Once | INTRAMUSCULAR | 0 refills | Status: CP
Start: 2020-04-22 — End: ?

## 2020-04-23 ENCOUNTER — Emergency Department: Admit: 2020-04-23 | Discharge: 2020-04-22

## 2020-04-23 MED ADMIN — LORAZEPAM 2 MG/ML IJ SYRG [86485]: 2 mg | INTRAMUSCULAR | @ 04:00:00 | Stop: 2020-04-23 | NDC 00409198503

## 2020-04-23 MED ADMIN — HALOPERIDOL LACTATE 5 MG/ML IJ SOLN [3584]: 5 mg | INTRAMUSCULAR | @ 04:00:00 | Stop: 2020-04-23 | NDC 63323047400

## 2020-04-23 NOTE — ED Notes
Pt is taking out his genitals and touching himself in inappropriate ways and making inappropriate gestures. Pt states, He (Daniel French, tech) wants to see it because he's gay. Asked the Pt politely to be respectful and to stop touching himself, but Pt continues to refuse and insult the staff. Police called and are at bedside at this time. Pt is being charged by [employer] w/ sexual harrassment.

## 2020-04-23 NOTE — ED Triage Notes
Pt ripped off fall band and threw it at this RN.

## 2020-04-23 NOTE — ED Notes
Pt threw Kleenex box at the door, this RN and [place] [employer] to the bedside to re-educate pt on expectations for appropriate behavior. Pt apologized and states I want to go home. This RN re-educated pt on why he cannot leave. Pt agrees to lay back in bed and cooperate with staff at this time.

## 2020-04-23 NOTE — ED Notes
49 yr old M arrives to ED via wheelchair with CC of intoxication of ETOH. Pt is very intoxicated and keeps expressing if he can leave. Informed the Pt that he is unable to leave at this point. Pt states his friend told him to come to the ED, so he walked over. Pt is upset over his girlfriend leaving him. Dr. Clent Ridges permitted him to have his shorts and shoes and phone with him. Pt denies SI or HI. Pt presents w/ HTN w/ BP 164/107. Pt placed in yellow gown and CO present at bedside. Some belongings are at the nurse's desk. Pt A&Ox4, skin w/d/i, eupenic on RA with bed lowered and side rails x 2 upright and call light next to Pt.    Belongings:  Materials engineer

## 2020-04-25 ENCOUNTER — Encounter: Admit: 2020-04-25 | Discharge: 2020-04-25

## 2020-04-25 ENCOUNTER — Emergency Department: Admit: 2020-04-25 | Discharge: 2020-04-25 | Disposition: A | Discharge status: Home / Self Care

## 2020-04-25 DIAGNOSIS — F10220 Alcohol dependence with intoxication, uncomplicated: Secondary | ICD-10-CM

## 2020-04-25 LAB — CBC AND DIFF
Lab: 0 10*3/uL (ref 0–0.45)
Lab: 0 K/UL (ref 0–0.20)
Lab: 17 (ref <20.7)
Lab: 5.6 10*3/uL (ref 4.5–11.0)

## 2020-04-25 LAB — COMPREHENSIVE METABOLIC PANEL
Lab: 0.4 mg/dL — ABNORMAL LOW (ref 0.3–1.2)
Lab: 0.7 mg/dL (ref 0.4–1.24)
Lab: 129 U/L — ABNORMAL HIGH (ref 25–110)
Lab: 148 MMOL/L — ABNORMAL HIGH (ref 137–147)
Lab: 15 K/UL — ABNORMAL HIGH (ref 3–12)
Lab: 25 MMOL/L (ref 21–30)
Lab: 29 U/L (ref 7–56)
Lab: 33 U/L (ref 7–40)
Lab: 4.3 g/dL (ref 3.5–5.0)
Lab: 7.2 g/dL (ref 6.0–8.0)
Lab: 8.7 mg/dL (ref 8.5–10.6)
Lab: 9 mg/dL (ref 7–25)
Lab: >60 mL/min (ref >60)
Lab: >60 mL/min (ref >60)

## 2020-04-25 LAB — ACETAMINOPHEN LEVEL: Lab: <10 ug/mL — ABNORMAL LOW (ref <20.1)

## 2020-04-25 LAB — MAGNESIUM: Lab: 2 mg/dL — ABNORMAL LOW (ref 1.6–2.6)

## 2020-04-25 LAB — SALICYLATE LEVEL

## 2020-04-25 MED ORDER — LACTATED RINGERS IV SOLP
1000 mL | INTRAVENOUS | 0 refills | Status: CP
Start: 2020-04-25 — End: ?
  Administered 2020-04-25: 08:00:00 1000 mL via INTRAVENOUS

## 2020-04-25 MED ORDER — DROPERIDOL 2.5 MG/ML IJ SOLN
2.5 mg | Freq: Once | INTRAVENOUS | 0 refills | Status: CP
Start: 2020-04-25 — End: ?
  Administered 2020-04-25: 09:00:00 2.5 mg via INTRAVENOUS

## 2020-04-25 MED ORDER — THIAMINE/FOLIC ACID IVPB
INTRAVENOUS | 0 refills | Status: CP
  Administered 2020-04-25 (×3): 50.000 mL via INTRAVENOUS

## 2020-04-25 NOTE — ED Provider Notes
Daniel French is a 49 y.o. male.    Chief Complaint:  Chief Complaint   Patient presents with   ? Alcohol Problem     Pt presents under the influence; Pt reports drinking 4 pints with his last drink being less than 2 minutes ago       History of Present Illness:  Daniel French is a 49 y.o. male with charted history of alcohol use disorder, depression who presents for alcohol intoxication.  At this time, patient is clinically intoxicated.  He is unable to state why he is in the emergency department.  Patient states that he has had a lot to drink today and states his last drink was less than 2 minutes ago.  Patient states he drank 4 pints today but is unable to say what he drank.  Patient with slurred speech, cursing at staff.  Further history is unable to be obtained at this time.          Review of Systems:  Review of Systems   Unable to perform ROS: Mental status change       Allergies:  Patient has no known allergies.    Past Medical History:  Medical History:   Diagnosis Date   ? Alcoholism (HCC)    ? Depression    ? Herniation of lumbar intervertebral disc    ? Nephrolithiasis    ? Pancreatitis    ? PTSD (post-traumatic stress disorder)    ? Seizure [facility])        Past Surgical History:  Surgical History:   Procedure Laterality Date   ? HX APPENDECTOMY     ? KIDNEY STONE SURGERY         Pertinent medical/surgical history reviewed    Social History:  Social History     Tobacco Use   ? Smoking status: Never Smoker   ? Smokeless tobacco: Current User     Types: Chew   Substance Use Topics   ? Alcohol use: Yes     Alcohol/week: 0.0 standard drinks   ? Drug use: No     Social History     Substance and Sexual Activity   Drug Use No             Family History:  Family History   Problem Relation Age of Onset   ? Heart Attack Other    ? Hypertension Other    ? None Reported Mother    ? Hypertension Father    ? Cancer Father    ? None Reported Sister    ? None Reported Brother    ? None Reported Sister ? None Reported Brother        Vitals:  ED Vitals    Date and Time T BP P RR SPO2P SPO2 User   04/25/20 0254 -- -- 72 18 PER MINUTE 71 97 % NR   04/25/20 0250 -- 165/94 86 -- -- -- NR   04/25/20 0143 36.8 ?C (98.2 ?F) 160/96 -- -- 71 96 % SS          Physical Exam:  Physical Exam  Vitals and nursing note reviewed.   Constitutional:       General: He is not in acute distress.     Appearance: He is well-developed. He is not toxic-appearing.      Comments: Patient clinically intoxicated, cursing and making inappropriate comments to staff   HENT:      Head: Normocephalic and atraumatic.  Right Ear: External ear normal.      Left Ear: External ear normal.      Mouth/Throat:      Pharynx: No oropharyngeal exudate.   Eyes:      Conjunctiva/sclera: Conjunctivae normal.      Pupils: Pupils are equal, round, and reactive to light.   Neck:      Vascular: No JVD.   Cardiovascular:      Rate and Rhythm: Normal rate and regular rhythm.      Pulses: Normal pulses.      Heart sounds: Normal heart sounds.   Pulmonary:      Effort: Pulmonary effort is normal. No respiratory distress.      Breath sounds: Normal breath sounds. No wheezing.   Abdominal:      General: Abdomen is flat. There is no distension.      Palpations: Abdomen is soft.      Tenderness: There is no abdominal tenderness.   Musculoskeletal:      Cervical back: Normal range of motion.      Right lower leg: No edema.      Left lower leg: No edema.   Skin:     General: Skin is warm and dry.   Neurological:      Mental Status: He is alert.      Cranial Nerves: No cranial nerve deficit.         Laboratory Results:  Labs Reviewed   CBC AND DIFF - Abnormal       Result Value Ref Range Status    White Blood Cells 5.6  4.5 - 11.0 K/UL Final    RBC 3.78 (*) 4.4 - 5.5 M/UL Final    Hemoglobin 12.2 (*) 13.5 - 16.5 GM/DL Final    Hematocrit 25.3 (*) 40 - 50 % Final    MCV 93.4  80 - 100 FL Final    MCH 32.2  26 - 34 PG Final    MCHC 34.5  32.0 - 36.0 G/DL Final    RDW 66.4  11 - 15 % Final    Platelet Count 293  150 - 400 K/UL Final    MPV 6.6 (*) 7 - 11 FL Final    Neutrophils 59  41 - 77 % Final    Lymphocytes 28  24 - 44 % Final    Monocytes 10  4 - 12 % Final    Eosinophils 2  0 - 5 % Final    Basophils 1  0 - 2 % Final    Absolute Neutrophil Count 3.40  1.8 - 7.0 K/UL Final    Absolute Lymph Count 1.57  1.0 - 4.8 K/UL Final    Absolute Monocyte Count 0.55  0 - 0.80 K/UL Final    Absolute Eosinophil Count 0.08  0 - 0.45 K/UL Final    Absolute Basophil Count 0.03  0 - 0.20 K/UL Final    MDW (Monocyte Distribution Width) 17.8  <20.7 Final   COMPREHENSIVE METABOLIC PANEL - Abnormal    Sodium 148 (*) 137 - 147 MMOL/L Final    Potassium 3.5  3.5 - 5.1 MMOL/L Final    Chloride 108  98 - 110 MMOL/L Final    Glucose 108 (*) 70 - 100 MG/DL Final    Blood Urea Nitrogen 9  7 - 25 MG/DL Final    Creatinine 4.03  0.4 - 1.24 MG/DL Final    Calcium 8.7  8.5 - 10.6 MG/DL Final    Total  Protein 7.2  6.0 - 8.0 G/DL Final    Total Bilirubin 0.4  0.3 - 1.2 MG/DL Final    Albumin 4.3  3.5 - 5.0 G/DL Final    Alk Phosphatase 129 (*) 25 - 110 U/L Final    AST (SGOT) 33  7 - 40 U/L Final    CO2 25  21 - 30 MMOL/L Final    ALT (SGPT) 29  7 - 56 U/L Final    Anion Gap 15 (*) 3 - 12 Final    eGFR Non African American >60  >60 mL/min Final    eGFR African American >60  >60 mL/min Final   MAGNESIUM    Magnesium 2.0  1.6 - 2.6 mg/dL Final   ACETAMINOPHEN LEVEL    Acetaminophen <10.0  <20.1 MCG/ML Final   SALICYLATE LEVEL    Salicylate <2.5  2.0 - 29.0 MG/DL Final          Radiology Interpretation:  No orders to display         EKG:  Sinus rhythm, rate 65 with prolonged PR interval, consistent with first-degree AV nodal block.  No axis deviation.  Normal intervals.  No STEMI.    ED Course:  Patient seen and evaluated by resident and attending for alcohol intoxication.  Previous records reviewed.  Patient's initial vitals reviewed.   PIV placed, labs drawn, patient on monitor  Patient repeatedly getting out of bed, slurring of speech, making inappropriate comments.  Patient given droperidol.  Patient given IV fluids and thiamine, folate.  Labs significant for mild anemia.  CMP normal.  Negative Tylenol and salicylate levels.  Patient was monitored in the emergency department.  He was reassessed frequently.  Patient care handed off to daytime resident with plan to reassess the patient for clinical sobriety.  At that time, the team will assess for suicidal ideation, homicidal ideation, safety plan for discharge home.    Retrospective chart review demonstrates that patient was monitored emergency department until clinically sober.  He denied SI, HI.  He was given return precautions and follow-up information on discharge.  He remained stable for discharge home.       ED Scoring:                             Coding    Facility Administered Meds:  Medications   thiamine (VITAMIN B-1) 100 mg, folic acid 1 mg in sodium chloride 0.9% (NS) 50 mL IVPB (has no administration in time range)   lactated ringers infusion (has no administration in time range)         Clinical Impression:  Clinical Impression   Acute alcoholic intoxication in alcoholism without complication [facility])       Disposition/Follow up  ED Disposition     ED Disposition    Discharge        No follow-up provider specified.    Medications:  New Prescriptions    No medications on file       Procedure Notes:  Procedures    Garner Nash, MD    Attestation / Supervision:

## 2020-04-25 NOTE — ED Notes
Pt ambulated with steady gait in no acute distress, AA&O x4, VSS. Pt reports needing to leave as soon as possible since he needs to be at work. ED provider notified.

## 2020-04-25 NOTE — Progress Notes
EM Update Note    Patient care was transitioned from Dr. Eliot Ford and Dr. Andrey Campanile with plans to reevaluate patient upon clinical sobriety.  Patient was observed in the emergency department for multiple hours.  On reevaluation he was clinically sober.  He was able to ambulate steadily.  He denied any SI or HI.  Patient was advised to refrain from heavy alcohol use.  ED return precautions were discussed.  Patient verbalized understanding and was in agreement with plan of care.  Patient was discharged home.    Plan of care was discussed with attending physician, Dr. Jon Gills.    Hilliard Clark, MD

## 2020-04-25 NOTE — ED Notes
Pt not wearing mask in triage and attempts to ambulate to waiting room, but stumbles. Pt is upset that I asked him to sit down and states You're just a cracker. That's all you are. You're not cool. You need to relax. Call the 5-0. I would rather go to jail.

## 2020-04-25 NOTE — ED Notes
This RN provides discharge teaching and instructions. Pt AAOx4, verbalizes understanding and denies further questions. Pt ambulates off unit with an even, steady gait in no acute distress. All belongings and paperwork in patient possession upon departure.

## 2020-04-25 NOTE — ED Notes
Pt requesting to leave. Per MD pt is not allowed to leave AMA. Provider at bedside to reevaluate pt.

## 2020-04-26 ENCOUNTER — Encounter: Admit: 2020-04-26 | Discharge: 2020-04-26

## 2020-04-27 ENCOUNTER — Emergency Department: Admit: 2020-04-27 | Discharge: 2020-04-27 | Discharge status: Against Medical Advice

## 2020-04-27 ENCOUNTER — Encounter: Admit: 2020-04-27 | Discharge: 2020-04-27

## 2020-04-27 NOTE — ED Notes
Second call no answer, patient left without being seen when vitals were attempting to be updated

## 2020-04-30 ENCOUNTER — Encounter: Admit: 2020-04-30 | Discharge: 2020-04-30 | Primary: Family

## 2020-04-30 ENCOUNTER — Emergency Department
Admit: 2020-04-30 | Discharge: 2020-04-30 | Disposition: A | Discharge status: Home / Self Care | Attending: Emergency Medical Services

## 2020-04-30 DIAGNOSIS — F10220 Alcohol dependence with intoxication, uncomplicated: Secondary | ICD-10-CM

## 2020-04-30 DIAGNOSIS — F902 Attention-deficit hyperactivity disorder, combined type: Secondary | ICD-10-CM

## 2020-04-30 MED ORDER — THIAMINE/FOLIC ACID IVPB
Freq: Every day | INTRAVENOUS | 0 refills | Status: DC
Start: 2020-04-30 — End: 2020-05-01
  Administered 2020-04-30 (×2): 50.000 mL via INTRAVENOUS

## 2020-04-30 MED ORDER — DEXTROAMPHETAMINE-AMPHETAMINE 20 MG PO CP24
20 mg | ORAL_CAPSULE | Freq: Every morning | ORAL | 0 refills | Status: DC
Start: 2020-04-30 — End: 2020-06-12

## 2020-04-30 MED ORDER — LACTATED RINGERS IV SOLP
1000 mL | INTRAVENOUS | 0 refills | Status: CP
Start: 2020-04-30 — End: ?
  Administered 2020-04-30: 21:00:00 1000 mL via INTRAVENOUS

## 2020-04-30 MED ORDER — DIPHENHYDRAMINE HCL 25 MG PO CAP
25 mg | Freq: Once | ORAL | 0 refills | Status: CP
Start: 2020-04-30 — End: ?
  Administered 2020-04-30: 21:00:00 25 mg via ORAL

## 2020-04-30 MED ORDER — ACETAMINOPHEN 500 MG PO TAB
1000 mg | Freq: Once | ORAL | 0 refills | Status: CP
Start: 2020-04-30 — End: ?
  Administered 2020-04-30: 21:00:00 1000 mg via ORAL

## 2020-05-07 ENCOUNTER — Inpatient Hospital Stay: Admit: 2020-05-07

## 2020-05-07 ENCOUNTER — Encounter: Admit: 2020-05-07 | Discharge: 2020-05-07 | Primary: Family

## 2020-05-07 DIAGNOSIS — F1021 Alcohol dependence, in remission: Secondary | ICD-10-CM

## 2020-05-07 DIAGNOSIS — R569 Unspecified convulsions: Secondary | ICD-10-CM

## 2020-05-07 DIAGNOSIS — Z87898 Personal history of other specified conditions: Secondary | ICD-10-CM

## 2020-05-07 DIAGNOSIS — F1020 Alcohol dependence, uncomplicated: Secondary | ICD-10-CM

## 2020-05-07 DIAGNOSIS — E876 Hypokalemia: Secondary | ICD-10-CM

## 2020-05-07 DIAGNOSIS — F329 Major depressive disorder, single episode, unspecified: Secondary | ICD-10-CM

## 2020-05-07 DIAGNOSIS — N200 Calculus of kidney: Secondary | ICD-10-CM

## 2020-05-07 DIAGNOSIS — M5126 Other intervertebral disc displacement, lumbar region: Secondary | ICD-10-CM

## 2020-05-07 DIAGNOSIS — K859 Acute pancreatitis without necrosis or infection, unspecified: Secondary | ICD-10-CM

## 2020-05-07 DIAGNOSIS — F10220 Alcohol dependence with intoxication, uncomplicated: Secondary | ICD-10-CM

## 2020-05-07 DIAGNOSIS — F4310 Post-traumatic stress disorder, unspecified: Secondary | ICD-10-CM

## 2020-05-07 LAB — MAGNESIUM
Lab: 1.9 mg/dL (ref 1.6–2.6)
Lab: 1.8 mg/dL (ref 1.6–2.6)

## 2020-05-07 LAB — COMPREHENSIVE METABOLIC PANEL
Lab: 0.2 mg/dL — ABNORMAL LOW (ref 0.3–1.2)
Lab: 0.6 mg/dL (ref 0.4–1.24)
Lab: 103 MMOL/L — ABNORMAL LOW (ref 98–110)
Lab: 103 mg/dL — ABNORMAL HIGH (ref 70–100)
Lab: 110 U/L — ABNORMAL LOW (ref 25–110)
Lab: 13 K/UL — ABNORMAL HIGH (ref 3–12)
Lab: 144 MMOL/L — ABNORMAL LOW (ref 137–147)
Lab: 28 MMOL/L (ref 21–30)
Lab: 3.4 MMOL/L — ABNORMAL LOW (ref 3.5–5.1)
Lab: 3.8 g/dL (ref 3.5–5.0)
Lab: 32 U/L (ref 7–56)
Lab: 49 U/L — ABNORMAL HIGH (ref 7–40)
Lab: 6.7 g/dL (ref 6.0–8.0)
Lab: 8 mg/dL (ref 7–25)
Lab: 8 mg/dL — ABNORMAL LOW (ref 8.5–10.6)
Lab: >60 mL/min (ref >60)
Lab: >60 mL/min — ABNORMAL HIGH (ref >60)

## 2020-05-07 LAB — CBC AND DIFF
Lab: 0 10*3/uL (ref 0–0.20)
Lab: 0 10*3/uL (ref 0–0.45)
Lab: 19 (ref <20.7)
Lab: 5.4 10*3/uL (ref 4.5–11.0)

## 2020-05-07 LAB — COVID-19 (SARS-COV-2) PCR

## 2020-05-07 LAB — POC GLUCOSE: Lab: 96 mg/dL (ref 70–100)

## 2020-05-07 LAB — COCAINE-URINE RANDOM: Lab: NEGATIVE

## 2020-05-07 LAB — ALCOHOL LEVEL: Lab: 385 mg/dL

## 2020-05-07 LAB — IRON + BINDING CAPACITY + %SAT+ FERRITIN
Lab: 11 % — ABNORMAL LOW (ref 28–42)
Lab: 123 ng/mL (ref 30–300)
Lab: 353 ug/dL (ref 270–380)
Lab: 40 ug/dL — ABNORMAL LOW (ref 50–185)

## 2020-05-07 LAB — BARBITURATES-URINE RANDOM: Lab: NEGATIVE

## 2020-05-07 LAB — OPIATES-URINE RANDOM: Lab: NEGATIVE

## 2020-05-07 LAB — BENZODIAZEPINES-URINE RANDOM: Lab: NEGATIVE

## 2020-05-07 LAB — PHENCYCLIDINES-URINE RANDOM: Lab: NEGATIVE

## 2020-05-07 LAB — CANNABINOIDS-URINE RANDOM: Lab: NEGATIVE

## 2020-05-07 LAB — AMPHETAMINES-URINE RANDOM: Lab: NEGATIVE

## 2020-05-07 MED ORDER — FOLIC ACID 1 MG PO TAB
1 mg | Freq: Every day | ORAL | 0 refills | Status: DC
Start: 2020-05-07 — End: 2020-05-09
  Administered 2020-05-08: 14:00:00 1 mg via ORAL

## 2020-05-07 MED ORDER — LORAZEPAM 2 MG/ML IJ SOLN
1 mg | INTRAMUSCULAR | 0 refills | Status: DC | PRN
Start: 2020-05-07 — End: 2020-05-09

## 2020-05-07 MED ORDER — CLONIDINE HCL 0.1 MG PO TAB
.1 mg | ORAL | 0 refills | Status: CP
Start: 2020-05-07 — End: ?
  Administered 2020-05-07 – 2020-05-08 (×3): 0.1 mg via ORAL

## 2020-05-07 MED ORDER — MULTIVITAMIN PO TAB
1 | Freq: Every day | ORAL | 0 refills | Status: DC
Start: 2020-05-07 — End: 2020-05-07

## 2020-05-07 MED ORDER — THIAMINE/FOLIC ACID IVPB
Freq: Once | INTRAVENOUS | 0 refills | Status: CP
Start: 2020-05-07 — End: ?
  Administered 2020-05-07 (×3): 50.000 mL via INTRAVENOUS

## 2020-05-07 MED ORDER — CLONIDINE HCL 0.1 MG PO TAB
.05 mg | Freq: Two times a day (BID) | ORAL | 0 refills | Status: DC
Start: 2020-05-07 — End: 2020-05-09

## 2020-05-07 MED ORDER — LORAZEPAM 2 MG/ML IJ SOLN
2 mg | INTRAMUSCULAR | 0 refills | Status: DC | PRN
Start: 2020-05-07 — End: 2020-05-09

## 2020-05-07 MED ORDER — CLONIDINE HCL 0.1 MG PO TAB
.1 mg | Freq: Two times a day (BID) | ORAL | 0 refills | Status: DC
Start: 2020-05-07 — End: 2020-05-09
  Administered 2020-05-08 – 2020-05-09 (×2): 0.1 mg via ORAL

## 2020-05-07 MED ORDER — THIAMINE MONONITRATE (VIT B1) 100 MG PO TAB
100 mg | Freq: Every day | ORAL | 0 refills | Status: DC
Start: 2020-05-07 — End: 2020-05-09
  Administered 2020-05-08: 14:00:00 100 mg via ORAL

## 2020-05-07 MED ORDER — DEXTROAMPHETAMINE-AMPHETAMINE 10 MG PO CP24
20 mg | Freq: Every morning | ORAL | 0 refills | Status: DC
Start: 2020-05-07 — End: 2020-05-09
  Administered 2020-05-07 – 2020-05-08 (×2): 20 mg via ORAL

## 2020-05-07 MED ORDER — ENOXAPARIN 40 MG/0.4 ML SC SYRG
40 mg | Freq: Every day | SUBCUTANEOUS | 0 refills | Status: DC
Start: 2020-05-07 — End: 2020-05-09
  Administered 2020-05-08 – 2020-05-09 (×2): 40 mg via SUBCUTANEOUS

## 2020-05-07 MED ORDER — LISINOPRIL 10 MG PO TAB
10 mg | Freq: Every day | ORAL | 0 refills | Status: DC
Start: 2020-05-07 — End: 2020-05-09
  Administered 2020-05-07 – 2020-05-08 (×2): 10 mg via ORAL

## 2020-05-07 MED ORDER — LACTATED RINGERS IV SOLP
1000 mL | Freq: Once | INTRAVENOUS | 0 refills | Status: CP
Start: 2020-05-07 — End: ?
  Administered 2020-05-07: 11:00:00 1000 mL via INTRAVENOUS

## 2020-05-07 MED ORDER — SODIUM CHLORIDE 0.9 % IV SOLP
INTRAVENOUS | 0 refills | Status: DC
Start: 2020-05-07 — End: 2020-05-07
  Administered 2020-05-07: 13:00:00 1000 mL via INTRAVENOUS

## 2020-05-07 MED ORDER — LORAZEPAM 2 MG/ML IJ SOLN
1 mg | INTRAVENOUS | 0 refills | Status: DC | PRN
Start: 2020-05-07 — End: 2020-05-09
  Administered 2020-05-07 – 2020-05-08 (×4): 1 mg via INTRAVENOUS

## 2020-05-07 MED ORDER — LORAZEPAM 1 MG PO TAB
2 mg | ORAL | 0 refills | Status: DC | PRN
Start: 2020-05-07 — End: 2020-05-09
  Administered 2020-05-09: 01:00:00 2 mg via ORAL

## 2020-05-07 MED ORDER — LORAZEPAM 2 MG/ML IJ SOLN
2 mg | INTRAVENOUS | 0 refills | Status: DC | PRN
Start: 2020-05-07 — End: 2020-05-09
  Administered 2020-05-07: 22:00:00 2 mg via INTRAVENOUS

## 2020-05-07 MED ORDER — LORAZEPAM 1 MG PO TAB
1 mg | ORAL | 0 refills | Status: DC | PRN
Start: 2020-05-07 — End: 2020-05-09
  Administered 2020-05-08 (×2): 1 mg via ORAL

## 2020-05-07 MED ORDER — POTASSIUM CHLORIDE 20 MEQ PO TBTQ
60 meq | Freq: Once | ORAL | 0 refills | Status: CP
Start: 2020-05-07 — End: ?
  Administered 2020-05-07: 11:00:00 60 meq via ORAL

## 2020-05-07 MED ORDER — ESCITALOPRAM OXALATE 10 MG PO TAB
10 mg | Freq: Every day | ORAL | 0 refills | Status: DC
Start: 2020-05-07 — End: 2020-05-09
  Administered 2020-05-07 – 2020-05-08 (×2): 10 mg via ORAL

## 2020-05-07 MED ORDER — TRAZODONE 50 MG PO TAB
50 mg | Freq: Every evening | ORAL | 0 refills | Status: DC | PRN
Start: 2020-05-07 — End: 2020-05-09
  Administered 2020-05-07 – 2020-05-09 (×3): 50 mg via ORAL

## 2020-05-07 MED ORDER — MELATONIN 3 MG PO TAB
6 mg | Freq: Every evening | ORAL | 0 refills | Status: DC
Start: 2020-05-07 — End: 2020-05-09
  Administered 2020-05-08 – 2020-05-09 (×2): 6 mg via ORAL

## 2020-05-07 MED ORDER — VITAMIN B COMPLEX PO TAB
1 | Freq: Every day | ORAL | 0 refills | Status: DC
Start: 2020-05-07 — End: 2020-05-09
  Administered 2020-05-07 – 2020-05-08 (×2): 1 via ORAL

## 2020-05-07 MED ORDER — MULTIVITAMIN PO TAB
1 | Freq: Once | ORAL | 0 refills | Status: CP
Start: 2020-05-07 — End: ?
  Administered 2020-05-07: 11:00:00 1 via ORAL

## 2020-05-07 NOTE — ED Notes
49 yo male presents to the ED with CC of alcohol intoxication. Pt states that his last drink was at 0200 and he stated that he had drank about 1L of fireball throughout the day. Pt states that he's been drinking about 1L of vodka and fireball for a couple weeks now. Pt states that he's wanting to get help with withdrawal. Pt states that he has past alcohol withdrawal induced seizures but his withdrawal symptoms include anxiety and tremors. Pt denies having any withdrawal symptoms upon arrival. Pt denies having CP, SOB, N/V/D, or loss of sensation. Pt is slurring his words and is A&Ox4, VSS, resting in the cart with call light in reach.     Medical History:   Diagnosis Date   ? Alcoholism (HCC)    ? Depression    ? Herniation of lumbar intervertebral disc    ? Nephrolithiasis    ? Pancreatitis    ? PTSD (post-traumatic stress disorder)    ? Seizure [facility])      Belongings: jeans, wallet, t-shirt, phone

## 2020-05-07 NOTE — ED Provider Notes
Daniel French is a 49 y.o. male.    Chief Complaint:  Chief Complaint   Patient presents with   ? Alcohol intoxication     reports drinking too much tonight. admits to 1L of fireball per day. last drink just PTA.       History of Present Illness:  The patient is a 49 year old male with a past medical history of alcoholism who presents to the emergency department for chief complaint of intoxication.  Patient reports drinking over fifth of fireball and vodka last night.  Last drink was 3 hours prior to arrival.  He reports history of withdrawal seizures and hallucinations.  Reports wanting to get detox.  Reports he'll likely stop drinking if discharged home.  Patient denying all other symptoms.  No chest pain, shortness of breath, fever, chills.  Denies recent trauma.           Review of Systems:  Review of Systems   Constitutional: Negative for fever.   HENT: Negative for sore throat.    Eyes: Negative for pain.   Respiratory: Negative for shortness of breath.    Cardiovascular: Negative for chest pain.   Gastrointestinal: Negative for abdominal pain.   Genitourinary: Negative for dysuria.   Musculoskeletal: Negative for myalgias.   Skin: Negative for rash.   Neurological: Negative for headaches.       Allergies:  Patient has no known allergies.    Past Medical History:  Medical History:   Diagnosis Date   ? Alcoholism (HCC)    ? Depression    ? Herniation of lumbar intervertebral disc    ? Nephrolithiasis    ? Pancreatitis    ? PTSD (post-traumatic stress disorder)    ? Seizure [facility])        Past Surgical History:  Surgical History:   Procedure Laterality Date   ? HX APPENDECTOMY     ? KIDNEY STONE SURGERY             Social History:  Social History     Tobacco Use   ? Smoking status: Never Smoker   ? Smokeless tobacco: Current User     Types: Chew   Substance Use Topics   ? Alcohol use: Yes     Alcohol/week: 0.0 standard drinks     Comment: 1L of fireball per day   ? Drug use: No     Social History Substance and Sexual Activity   Drug Use No             Family History:  Family History   Problem Relation Age of Onset   ? Heart Attack Other    ? Hypertension Other    ? None Reported Mother    ? Hypertension Father    ? Cancer Father    ? None Reported Sister    ? None Reported Brother    ? None Reported Sister    ? None Reported Brother        Vitals:  ED Vitals    Date and Time T BP P RR SPO2P SPO2 User   05/07/20 0630 -- 140/92 95 20 PER MINUTE -- -- SZ   05/07/20 0416 36.5 ?C (97.7 ?F) 151/88 -- 18 PER MINUTE 105 94 % JT          Physical Exam:  Physical Exam  Vitals and nursing note reviewed.   Constitutional:       General: He is not in acute distress.     Appearance:  Normal appearance. He is not ill-appearing, toxic-appearing or diaphoretic.      Comments: Intoxicated   HENT:      Nose: No rhinorrhea.   Eyes:      General:         Right eye: No discharge.         Left eye: No discharge.   Neck:      Trachea: No tracheal deviation.      Comments: Trachea midline  Cardiovascular:      Rate and Rhythm: Regular rhythm. Tachycardia present.      Heart sounds: No murmur. No friction rub. No gallop.    Pulmonary:      Effort: Pulmonary effort is normal. No respiratory distress.      Breath sounds: No wheezing or rales.   Abdominal:      General: Abdomen is flat. There is no distension.      Palpations: Abdomen is soft.      Tenderness: There is no abdominal tenderness. There is no guarding or rebound.   Musculoskeletal:      Right lower leg: No edema.      Left lower leg: No edema.      Comments: No gross deformities   Skin:     Coloration: Skin is not jaundiced.   Neurological:      Mental Status: He is alert.      Comments: Extremity movements grossly intact         Laboratory Results:  Labs Reviewed   CBC AND DIFF - Abnormal       Result Value Ref Range Status    White Blood Cells 5.4  4.5 - 11.0 K/UL Final    RBC 3.50 (*) 4.4 - 5.5 M/UL Final    Hemoglobin 11.7 (*) 13.5 - 16.5 GM/DL Final    Hematocrit 16.1 (*) 40 - 50 % Final    MCV 94.1  80 - 100 FL Final    MCH 33.4  26 - 34 PG Final    MCHC 35.5  32.0 - 36.0 G/DL Final    RDW 09.6 (*) 11 - 15 % Final    Platelet Count 255  150 - 400 K/UL Final    MPV 6.6 (*) 7 - 11 FL Final    Neutrophils 57  41 - 77 % Final    Lymphocytes 22 (*) 24 - 44 % Final    Monocytes 19 (*) 4 - 12 % Final    Eosinophils 1  0 - 5 % Final    Basophils 1  0 - 2 % Final    Absolute Neutrophil Count 3.16  1.8 - 7.0 K/UL Final    Absolute Lymph Count 1.17  1.0 - 4.8 K/UL Final    Absolute Monocyte Count 1.03 (*) 0 - 0.80 K/UL Final    Absolute Eosinophil Count 0.03  0 - 0.45 K/UL Final    Absolute Basophil Count 0.04  0 - 0.20 K/UL Final    MDW (Monocyte Distribution Width) 19.2  <20.7 Final   COMPREHENSIVE METABOLIC PANEL - Abnormal    Sodium 144  137 - 147 MMOL/L Final    Potassium 3.4 (*) 3.5 - 5.1 MMOL/L Final    Chloride 103  98 - 110 MMOL/L Final    Glucose 103 (*) 70 - 100 MG/DL Final    Blood Urea Nitrogen 8  7 - 25 MG/DL Final    Creatinine 0.45  0.4 - 1.24 MG/DL Final    Calcium 8.0 (*)  8.5 - 10.6 MG/DL Final    Total Protein 6.7  6.0 - 8.0 G/DL Final    Total Bilirubin 0.2 (*) 0.3 - 1.2 MG/DL Final    Albumin 3.8  3.5 - 5.0 G/DL Final    Alk Phosphatase 110  25 - 110 U/L Final    AST (SGOT) 49 (*) 7 - 40 U/L Final    CO2 28  21 - 30 MMOL/L Final    ALT (SGPT) 32  7 - 56 U/L Final    Anion Gap 13 (*) 3 - 12 Final    eGFR Non African American >60  >60 mL/min Final    eGFR African American >60  >60 mL/min Final   IRON + BINDING CAPACITY + %SAT+ FERRITIN - Abnormal    Iron 40 (*) 50 - 185 MCG/DL Final    Iron Binding-TIBC 353  270 - 380 MCG/DL Final    % Saturation 11 (*) 28 - 42 % Final    Ferritin 123  30 - 300 NG/ML Final   COVID-19 (SARS-COV-2) PCR   POC GLUCOSE    Glucose, POC 96  70 - 100 MG/DL Final   MAGNESIUM    Magnesium 1.8  1.6 - 2.6 mg/dL Final   AMPHETAMINES-URINE RANDOM    Amphetamines NEG  NEG-NEG Final   BARBITURATES-URINE RANDOM    Barbiturates,Urine NEG  NEG-NEG Final BENZODIAZEPINES-URINE RANDOM    Benzodiazepines NEG  NEG-NEG Final   CANNABINOIDS-URINE RANDOM    THC NEG  NEG-NEG Final   COCAINE-URINE RANDOM    Cocaine-Urine NEG  NEG-NEG Final   OPIATES-URINE RANDOM    Opiates-Urine NEG  NEG-NEG Final   PHENCYCLIDINES-URINE RANDOM    Phencyclidine (PCP) NEG  NEG-NEG Final   ALCOHOL LEVEL    Alcohol 385  MG/DL Final   MAGNESIUM    Magnesium 1.9  1.6 - 2.6 mg/dL Final   POC GLUCOSE     POC Glucose (Download): 96    Radiology Interpretation:    No orders to display         EKG:        ED Course:    -Patient seen today for chief complaint intoxication.  -Patient evaluated by resident and attending.  -Available records reviewed.   -Patient's vitals were reviewed. Very mild tachycardia.  -Patient's labs were largely unremarkable.   -Patient given fluids, a multivitamin and potassium.    -Patient wants to stop drinking.  He has a history of withdrawal seizures.  He would likely withdrawal if discharged.  I discussed the case with medicine. They agreed to admit the patient for further care.  -I discussed results, answered questions and reviewed the plan with the patient. Patient had no further clinical deterioration while in the emergency department and was in stable condition upon transfer to the inpatient unit.               ED Scoring:                             MDM  Reviewed: previous chart, nursing note and vitals  Reviewed previous: labs  Interpretation: labs  Consults: Admitting Provider        Facility Administered Meds:  Medications   cloNIDine (CATAPRESS) tablet 0.1 mg (has no administration in time range)     Followed by   cloNIDine (CATAPRESS) tablet 0.1 mg (has no administration in time range)     Followed by   cloNIDine (  CATAPRESS) tablet 0.05 mg (has no administration in time range)   LORazepam (ATIVAN) tablet 1 mg (has no administration in time range)     Or   LORazepam (ATIVAN) injection 1 mg (has no administration in time range)     Or   LORazepam (ATIVAN) injection 1 mg (has no administration in time range)   LORazepam (ATIVAN) tablet 2 mg (has no administration in time range)     Or   LORazepam (ATIVAN) injection 2 mg (has no administration in time range)     Or   LORazepam (ATIVAN) injection 2 mg (has no administration in time range)   thiamine (VITAMIN B-1) 100 mg, folic acid 1 mg in sodium chloride 0.9% (NS) 50 mL IVPB (has no administration in time range)   enoxaparin (LOVENOX) syringe 40 mg (has no administration in time range)   sodium chloride 0.9 %   infusion (has no administration in time range)   lactated ringers infusion (0 mL Intravenous Infusion Stopped 05/07/20 0618)   vitamins, multiple tablet 1 tablet (1 tablet Oral Given 05/07/20 0530)   potassium chloride SR (K-DUR) tablet 60 mEq (60 mEq Oral Given 05/07/20 5409)         Clinical Impression:  Clinical Impression   Acute alcoholic intoxication in alcoholism without complication (HCC)   History of seizures   Hypokalemia       Disposition/Follow up  ED Disposition     ED Disposition    Admit        No follow-up provider specified.    Medications:  New Prescriptions    No medications on file       Procedure Notes:  Procedures      Attestation / Supervision:        Horatio Pel, DO

## 2020-05-07 NOTE — Progress Notes
Patient arrived on unit via wheelchair accompanied by transport. Patient transferred to the bed with assistance. Assessment completed, refer to flowsheet for details. Orders released, reviewed, and implemented as appropriate. Oriented to surroundings, call light within reach. Plan of care reviewed.  Will continue to monitor and assess.

## 2020-05-07 NOTE — ED Notes
RN attempted to give report. RN not available. Will call back in 15 min.

## 2020-05-08 MED ORDER — NICOTINE 21 MG/24 HR TD PT24
1 | Freq: Every day | TRANSDERMAL | 0 refills | Status: DC
Start: 2020-05-08 — End: 2020-05-09
  Administered 2020-05-08: 23:00:00 1 via TRANSDERMAL

## 2020-05-09 ENCOUNTER — Encounter: Admit: 2020-05-09 | Discharge: 2020-05-09 | Primary: Family

## 2020-05-09 MED ORDER — GABAPENTIN 300 MG PO CAP
300 mg | ORAL | 0 refills | Status: DC
Start: 2020-05-09 — End: 2020-05-09

## 2020-05-09 MED ORDER — GABAPENTIN 300 MG PO CAP
600 mg | ORAL | 0 refills | Status: DC
Start: 2020-05-09 — End: 2020-05-09

## 2020-05-09 MED ORDER — GABAPENTIN 300 MG PO CAP
900 mg | ORAL | 0 refills | Status: DC
Start: 2020-05-09 — End: 2020-05-09

## 2020-05-09 MED ORDER — LACTATED RINGERS IV SOLP
1000 mL | Freq: Once | INTRAVENOUS | 0 refills | Status: DC
Start: 2020-05-09 — End: 2020-05-09

## 2020-06-09 ENCOUNTER — Encounter: Admit: 2020-06-09 | Discharge: 2020-06-09 | Primary: Family

## 2020-06-09 DIAGNOSIS — F329 Major depressive disorder, single episode, unspecified: Secondary | ICD-10-CM

## 2020-06-09 DIAGNOSIS — F1020 Alcohol dependence, uncomplicated: Secondary | ICD-10-CM

## 2020-06-09 DIAGNOSIS — K859 Acute pancreatitis without necrosis or infection, unspecified: Secondary | ICD-10-CM

## 2020-06-09 DIAGNOSIS — M5126 Other intervertebral disc displacement, lumbar region: Secondary | ICD-10-CM

## 2020-06-09 DIAGNOSIS — F4310 Post-traumatic stress disorder, unspecified: Secondary | ICD-10-CM

## 2020-06-09 DIAGNOSIS — N200 Calculus of kidney: Secondary | ICD-10-CM

## 2020-06-09 DIAGNOSIS — R569 Unspecified convulsions: Secondary | ICD-10-CM

## 2020-06-10 ENCOUNTER — Emergency Department: Admit: 2020-06-10 | Discharge: 2020-06-09

## 2020-06-10 ENCOUNTER — Encounter: Admit: 2020-06-10 | Discharge: 2020-06-10 | Primary: Family

## 2020-06-10 DIAGNOSIS — F1020 Alcohol dependence, uncomplicated: Secondary | ICD-10-CM

## 2020-06-10 DIAGNOSIS — F4310 Post-traumatic stress disorder, unspecified: Secondary | ICD-10-CM

## 2020-06-10 DIAGNOSIS — M5126 Other intervertebral disc displacement, lumbar region: Secondary | ICD-10-CM

## 2020-06-10 DIAGNOSIS — R569 Unspecified convulsions: Secondary | ICD-10-CM

## 2020-06-10 DIAGNOSIS — K859 Acute pancreatitis without necrosis or infection, unspecified: Secondary | ICD-10-CM

## 2020-06-10 DIAGNOSIS — N200 Calculus of kidney: Secondary | ICD-10-CM

## 2020-06-10 DIAGNOSIS — F329 Major depressive disorder, single episode, unspecified: Secondary | ICD-10-CM

## 2020-06-10 MED ORDER — SODIUM CHLORIDE 0.9 % IV SOLP
1000 mL | INTRAVENOUS | 0 refills | Status: CP
Start: 2020-06-10 — End: ?
  Administered 2020-06-10: 08:00:00 1000 mL via INTRAVENOUS

## 2020-06-10 MED ORDER — LORAZEPAM 2 MG/ML IJ SOLN
2 mg | INTRAMUSCULAR | 0 refills | Status: DC | PRN
Start: 2020-06-10 — End: 2020-06-12

## 2020-06-10 MED ORDER — LORAZEPAM 2 MG/ML IJ SOLN
2 mg | INTRAVENOUS | 0 refills | Status: DC | PRN
Start: 2020-06-10 — End: 2020-06-12

## 2020-06-10 MED ORDER — ENOXAPARIN 40 MG/0.4 ML SC SYRG
40 mg | Freq: Every day | SUBCUTANEOUS | 0 refills | Status: DC
Start: 2020-06-10 — End: 2020-06-12
  Administered 2020-06-10 – 2020-06-12 (×3): 40 mg via SUBCUTANEOUS

## 2020-06-10 MED ORDER — THIAMINE MONONITRATE (VIT B1) 100 MG PO TAB
100 mg | Freq: Every day | ORAL | 0 refills | Status: DC
Start: 2020-06-10 — End: 2020-06-12
  Administered 2020-06-10 – 2020-06-12 (×3): 100 mg via ORAL

## 2020-06-10 MED ORDER — LORAZEPAM 2 MG/ML IJ SOLN
1 mg | INTRAVENOUS | 0 refills | Status: DC | PRN
Start: 2020-06-10 — End: 2020-06-12

## 2020-06-10 MED ORDER — FOLIC ACID 1 MG PO TAB
1 mg | Freq: Every day | ORAL | 0 refills | Status: DC
Start: 2020-06-10 — End: 2020-06-12
  Administered 2020-06-10 – 2020-06-12 (×3): 1 mg via ORAL

## 2020-06-10 MED ORDER — LISINOPRIL 10 MG PO TAB
10 mg | Freq: Every day | ORAL | 0 refills | Status: DC
Start: 2020-06-10 — End: 2020-06-10

## 2020-06-10 MED ORDER — LISINOPRIL 10 MG PO TAB
10 mg | Freq: Every day | ORAL | 0 refills | Status: DC
Start: 2020-06-10 — End: 2020-06-12
  Administered 2020-06-10 – 2020-06-12 (×3): 10 mg via ORAL

## 2020-06-10 MED ORDER — NICOTINE (POLACRILEX) 4 MG BU GUM
4 mg | BUCCAL | 0 refills | Status: DC | PRN
Start: 2020-06-10 — End: 2020-06-12
  Administered 2020-06-11: 22:00:00 4 mg via BUCCAL

## 2020-06-10 MED ORDER — SODIUM CHLORIDE 0.9 % IV SOLP
1000 mL | Freq: Once | INTRAVENOUS | 0 refills | Status: CP
Start: 2020-06-10 — End: ?
  Administered 2020-06-10: 12:00:00 1000 mL via INTRAVENOUS

## 2020-06-10 MED ORDER — THIAMINE/FOLIC ACID IVPB
Freq: Once | INTRAVENOUS | 0 refills | Status: CP
Start: 2020-06-10 — End: ?
  Administered 2020-06-10 (×3): 50.000 mL via INTRAVENOUS

## 2020-06-10 MED ORDER — MELATONIN 5 MG PO TAB
10 mg | Freq: Every evening | ORAL | 0 refills | Status: DC
Start: 2020-06-10 — End: 2020-06-12
  Administered 2020-06-12: 03:00:00 10 mg via ORAL

## 2020-06-10 MED ORDER — NICOTINE 21 MG/24 HR TD PT24
1 | TRANSDERMAL | 0 refills | Status: DC
Start: 2020-06-10 — End: 2020-06-12
  Administered 2020-06-10 – 2020-06-12 (×2): 1 via TRANSDERMAL

## 2020-06-10 MED ORDER — LORAZEPAM 1 MG PO TAB
1 mg | ORAL | 0 refills | Status: DC | PRN
Start: 2020-06-10 — End: 2020-06-12
  Administered 2020-06-10 – 2020-06-12 (×4): 1 mg via ORAL

## 2020-06-10 MED ORDER — LORAZEPAM 2 MG/ML IJ SOLN
1 mg | INTRAMUSCULAR | 0 refills | Status: DC | PRN
Start: 2020-06-10 — End: 2020-06-12

## 2020-06-10 MED ORDER — LORAZEPAM 1 MG PO TAB
1 mg | Freq: Once | ORAL | 0 refills | Status: CP
Start: 2020-06-10 — End: ?
  Administered 2020-06-10: 16:00:00 1 mg via ORAL

## 2020-06-10 MED ORDER — LORAZEPAM 1 MG PO TAB
2 mg | ORAL | 0 refills | Status: DC | PRN
Start: 2020-06-10 — End: 2020-06-12
  Administered 2020-06-10 – 2020-06-11 (×3): 2 mg via ORAL

## 2020-06-10 MED ORDER — COVID-19 VACC,MRNA(MODERNA)-PF 100 MCG/0.5 ML IM SUSP
100 ug | INTRAMUSCULAR | 0 refills | Status: DC
Start: 2020-06-10 — End: 2020-06-12

## 2020-06-11 MED ORDER — DIPHENHYDRAMINE HCL 25 MG PO CAP
25 mg | ORAL | 0 refills | Status: DC | PRN
Start: 2020-06-11 — End: 2020-06-12
  Administered 2020-06-12: 03:00:00 25 mg via ORAL

## 2020-06-12 ENCOUNTER — Encounter: Admit: 2020-06-12 | Discharge: 2020-06-12 | Payer: PRIVATE HEALTH INSURANCE | Primary: Family

## 2020-06-12 MED ORDER — THIAMINE MONONITRATE (VIT B1) 100 MG PO TAB
100 mg | ORAL_TABLET | Freq: Every day | ORAL | 1 refills | 10.00000 days | Status: DC
Start: 2020-06-12 — End: 2020-06-19

## 2020-06-12 MED ORDER — FOLIC ACID 1 MG PO TAB
1 mg | ORAL_TABLET | Freq: Every day | ORAL | 1 refills | Status: DC
Start: 2020-06-12 — End: 2020-06-19
  Filled 2020-06-12: qty 30, 30d supply, fill #1

## 2020-06-12 MED ORDER — LORAZEPAM 1 MG PO TAB
.5 mg | ORAL_TABLET | ORAL | 0 refills | 12.00000 days | Status: DC | PRN
Start: 2020-06-12 — End: 2020-06-12

## 2020-06-12 MED ORDER — LORAZEPAM 0.5 MG PO TAB
.5 mg | ORAL_TABLET | ORAL | 0 refills | 12.00000 days | Status: DC | PRN
Start: 2020-06-12 — End: 2020-06-19

## 2020-06-12 MED ORDER — LISINOPRIL 10 MG PO TAB
10 mg | ORAL_TABLET | Freq: Every day | ORAL | 1 refills | Status: CN
Start: 2020-06-12 — End: ?

## 2020-06-12 MED ORDER — LISINOPRIL 10 MG PO TAB
10 mg | ORAL_TABLET | Freq: Every day | ORAL | 1 refills | Status: DC
Start: 2020-06-12 — End: 2020-06-23
  Filled 2020-06-12: qty 90, 90d supply, fill #1

## 2020-06-13 ENCOUNTER — Encounter: Admit: 2020-06-13 | Discharge: 2020-06-13 | Primary: Family

## 2020-06-17 ENCOUNTER — Encounter: Admit: 2020-06-17 | Discharge: 2020-06-17 | Primary: Family

## 2020-06-18 ENCOUNTER — Emergency Department: Admit: 2020-06-18 | Discharge: 2020-06-18

## 2020-06-18 ENCOUNTER — Encounter: Admit: 2020-06-18 | Discharge: 2020-06-18 | Primary: Family

## 2020-06-18 ENCOUNTER — Inpatient Hospital Stay: Admit: 2020-06-18

## 2020-06-18 DIAGNOSIS — M5126 Other intervertebral disc displacement, lumbar region: Secondary | ICD-10-CM

## 2020-06-18 DIAGNOSIS — F329 Major depressive disorder, single episode, unspecified: Secondary | ICD-10-CM

## 2020-06-18 DIAGNOSIS — F1020 Alcohol dependence, uncomplicated: Secondary | ICD-10-CM

## 2020-06-18 DIAGNOSIS — R569 Unspecified convulsions: Secondary | ICD-10-CM

## 2020-06-18 DIAGNOSIS — F4310 Post-traumatic stress disorder, unspecified: Secondary | ICD-10-CM

## 2020-06-18 DIAGNOSIS — N200 Calculus of kidney: Secondary | ICD-10-CM

## 2020-06-18 DIAGNOSIS — K859 Acute pancreatitis without necrosis or infection, unspecified: Secondary | ICD-10-CM

## 2020-06-18 DIAGNOSIS — F10139 Alcohol abuse with withdrawal (HCC): Secondary | ICD-10-CM

## 2020-06-18 LAB — ALCOHOL LEVEL: Lab: 120 mg/dL — ABNORMAL LOW (ref 3.5–5.1)

## 2020-06-18 LAB — COMPREHENSIVE METABOLIC PANEL
Lab: 0.7 mg/dL (ref 0.4–1.24)
Lab: 0.9 mg/dL (ref 0.3–1.2)
Lab: 101 MMOL/L — ABNORMAL LOW (ref 98–110)
Lab: 106 U/L — ABNORMAL LOW (ref 25–110)
Lab: 11 mg/dL (ref 7–25)
Lab: 112 mg/dL — ABNORMAL HIGH (ref 70–100)
Lab: 141 MMOL/L — ABNORMAL LOW (ref 137–147)
Lab: 3.9 g/dL (ref 3.5–5.0)
Lab: 6.8 g/dL (ref 6.0–8.0)
Lab: 68 U/L — ABNORMAL HIGH (ref 7–56)
Lab: 78 U/L — ABNORMAL HIGH (ref 7–40)
Lab: 8.5 mg/dL — ABNORMAL HIGH (ref 8.5–10.6)

## 2020-06-18 LAB — CBC AND DIFF: Lab: 3 10*3/uL — ABNORMAL LOW (ref 4.5–11.0)

## 2020-06-18 LAB — COVID-19 (SARS-COV-2) PCR

## 2020-06-18 LAB — MAGNESIUM: Lab: 1.6 mg/dL (ref 1.6–2.6)

## 2020-06-18 LAB — PHOSPHORUS: Lab: 3.1 mg/dL (ref 2.0–4.5)

## 2020-06-18 LAB — CREATINE KINASE-CPK: Lab: 250 U/L — ABNORMAL HIGH (ref 35–232)

## 2020-06-18 LAB — LACTIC ACID(LACTATE): Lab: 2.5 MMOL/L — ABNORMAL HIGH (ref 0.5–2.0)

## 2020-06-18 MED ORDER — CLONIDINE HCL 0.2 MG PO TAB
.2 mg | Freq: Two times a day (BID) | ORAL | 0 refills | Status: CP
Start: 2020-06-18 — End: ?
  Administered 2020-06-19 – 2020-06-22 (×6): 0.2 mg via ORAL

## 2020-06-18 MED ORDER — ENOXAPARIN 40 MG/0.4 ML SC SYRG
40 mg | Freq: Every day | SUBCUTANEOUS | 0 refills | Status: DC
Start: 2020-06-18 — End: 2020-06-23
  Administered 2020-06-19 – 2020-06-23 (×5): 40 mg via SUBCUTANEOUS

## 2020-06-18 MED ORDER — GABAPENTIN 300 MG PO CAP
900 mg | ORAL | 0 refills | Status: CP
Start: 2020-06-18 — End: ?
  Administered 2020-06-18 – 2020-06-21 (×8): 900 mg via ORAL

## 2020-06-18 MED ORDER — GABAPENTIN 300 MG PO CAP
600 mg | ORAL | 0 refills | Status: DC
Start: 2020-06-18 — End: 2020-06-18

## 2020-06-18 MED ORDER — ACETAMINOPHEN 500 MG PO TAB
1000 mg | ORAL | 0 refills | Status: DC | PRN
Start: 2020-06-18 — End: 2020-06-23
  Administered 2020-06-19 – 2020-06-22 (×3): 1000 mg via ORAL

## 2020-06-18 MED ORDER — CLONIDINE HCL 0.1 MG PO TAB
.1 mg | Freq: Two times a day (BID) | ORAL | 0 refills | Status: DC
Start: 2020-06-18 — End: 2020-06-23
  Administered 2020-06-22 – 2020-06-23 (×3): 0.1 mg via ORAL

## 2020-06-18 MED ORDER — CLONIDINE HCL 0.1 MG PO TAB
.1 mg | ORAL | 0 refills | Status: CP
Start: 2020-06-18 — End: ?
  Administered 2020-06-18 – 2020-06-19 (×3): 0.1 mg via ORAL

## 2020-06-18 MED ORDER — ONDANSETRON 4 MG PO TBDI
4 mg | ORAL | 0 refills | Status: DC | PRN
Start: 2020-06-18 — End: 2020-06-23
  Administered 2020-06-22 – 2020-06-23 (×2): 4 mg via ORAL

## 2020-06-18 MED ORDER — CLONIDINE HCL 0.1 MG PO TAB
.1 mg | Freq: Two times a day (BID) | ORAL | 0 refills | Status: DC
Start: 2020-06-18 — End: 2020-06-18

## 2020-06-18 MED ORDER — LACTATED RINGERS IV SOLP
1000 mL | Freq: Once | INTRAVENOUS | 0 refills | Status: CP
Start: 2020-06-18 — End: ?

## 2020-06-18 MED ORDER — SODIUM CHLORIDE 0.9 % IV SOLP
1000 mL | INTRAVENOUS | 0 refills | Status: CP
Start: 2020-06-18 — End: ?
  Administered 2020-06-18: 13:00:00 1000 mL via INTRAVENOUS

## 2020-06-18 MED ORDER — POTASSIUM CHLORIDE 20 MEQ PO TBTQ
60 meq | Freq: Once | ORAL | 0 refills | Status: CP
Start: 2020-06-18 — End: ?
  Administered 2020-06-18: 16:00:00 60 meq via ORAL

## 2020-06-18 MED ORDER — GABAPENTIN 300 MG PO CAP
300 mg | ORAL | 0 refills | Status: DC
Start: 2020-06-18 — End: 2020-06-18

## 2020-06-18 MED ORDER — LORAZEPAM 2 MG/ML IJ SOLN
2 mg | INTRAMUSCULAR | 0 refills | Status: DC | PRN
Start: 2020-06-18 — End: 2020-06-22

## 2020-06-18 MED ORDER — LORAZEPAM 2 MG/ML IJ SOLN
1 mg | INTRAVENOUS | 0 refills | Status: DC | PRN
Start: 2020-06-18 — End: 2020-06-22
  Administered 2020-06-19 (×2): 1 mg via INTRAVENOUS

## 2020-06-18 MED ORDER — GABAPENTIN 300 MG PO CAP
900 mg | ORAL | 0 refills | Status: DC
Start: 2020-06-18 — End: 2020-06-18

## 2020-06-18 MED ORDER — PHENOBARBITAL SODIUM 130 MG/ML IJ SOLN
130 mg | Freq: Once | INTRAVENOUS | 0 refills | Status: CP
Start: 2020-06-18 — End: ?
  Administered 2020-06-18: 15:00:00 130 mg via INTRAVENOUS

## 2020-06-18 MED ORDER — GABAPENTIN 300 MG PO CAP
600 mg | ORAL | 0 refills | Status: DC
Start: 2020-06-18 — End: 2020-06-23
  Administered 2020-06-22 – 2020-06-23 (×4): 600 mg via ORAL

## 2020-06-18 MED ORDER — ONDANSETRON HCL (PF) 4 MG/2 ML IJ SOLN
4 mg | Freq: Once | INTRAVENOUS | 0 refills | Status: CP
Start: 2020-06-18 — End: ?
  Administered 2020-06-18: 15:00:00 4 mg via INTRAVENOUS

## 2020-06-18 MED ORDER — CLONIDINE HCL 0.2 MG PO TAB
.2 mg | Freq: Two times a day (BID) | ORAL | 0 refills | Status: DC
Start: 2020-06-18 — End: 2020-06-18

## 2020-06-18 MED ORDER — LORAZEPAM 2 MG/ML IJ SOLN
2 mg | Freq: Once | INTRAVENOUS | 0 refills | Status: CP
Start: 2020-06-18 — End: ?
  Administered 2020-06-18: 13:00:00 2 mg via INTRAVENOUS

## 2020-06-18 MED ORDER — LORAZEPAM 2 MG/ML IJ SOLN
2 mg | INTRAVENOUS | 0 refills | Status: DC | PRN
Start: 2020-06-18 — End: 2020-06-22
  Administered 2020-06-18 – 2020-06-22 (×12): 2 mg via INTRAVENOUS

## 2020-06-18 MED ORDER — GABAPENTIN 300 MG PO CAP
300 mg | ORAL | 0 refills | Status: DC
Start: 2020-06-18 — End: 2020-06-23

## 2020-06-18 MED ORDER — THIAMINE/FOLIC ACID IVPB
Freq: Once | INTRAVENOUS | 0 refills | Status: CP
Start: 2020-06-18 — End: ?
  Administered 2020-06-18 (×3): 50.000 mL via INTRAVENOUS

## 2020-06-18 MED ORDER — LORAZEPAM 1 MG PO TAB
2 mg | ORAL | 0 refills | Status: DC | PRN
Start: 2020-06-18 — End: 2020-06-23
  Administered 2020-06-20 – 2020-06-22 (×2): 2 mg via ORAL

## 2020-06-18 MED ORDER — FOLIC ACID 1 MG PO TAB
1 mg | Freq: Every day | ORAL | 0 refills | Status: DC
Start: 2020-06-18 — End: 2020-06-23
  Administered 2020-06-19 – 2020-06-23 (×5): 1 mg via ORAL

## 2020-06-18 MED ORDER — LORAZEPAM 1 MG PO TAB
1 mg | ORAL | 0 refills | Status: DC | PRN
Start: 2020-06-18 — End: 2020-06-23
  Administered 2020-06-20 – 2020-06-23 (×6): 1 mg via ORAL

## 2020-06-18 MED ORDER — MELATONIN 5 MG PO TAB
10 mg | Freq: Every evening | ORAL | 0 refills | Status: DC
Start: 2020-06-18 — End: 2020-06-23
  Administered 2020-06-19 – 2020-06-23 (×5): 10 mg via ORAL

## 2020-06-18 MED ORDER — THIAMINE MONONITRATE (VIT B1) 100 MG PO TAB
100 mg | Freq: Every day | ORAL | 0 refills | Status: DC
Start: 2020-06-18 — End: 2020-06-23
  Administered 2020-06-19 – 2020-06-23 (×5): 100 mg via ORAL

## 2020-06-18 MED ORDER — CLONIDINE HCL 0.1 MG PO TAB
.1 mg | ORAL | 0 refills | Status: DC
Start: 2020-06-18 — End: 2020-06-18

## 2020-06-18 MED ORDER — ONDANSETRON HCL (PF) 4 MG/2 ML IJ SOLN
4 mg | INTRAVENOUS | 0 refills | Status: DC | PRN
Start: 2020-06-18 — End: 2020-06-23
  Administered 2020-06-19 – 2020-06-21 (×3): 4 mg via INTRAVENOUS

## 2020-06-18 MED ORDER — LACTATED RINGERS IV SOLP
1000 mL | Freq: Once | INTRAVENOUS | 0 refills | Status: CP
Start: 2020-06-18 — End: ?
  Administered 2020-06-18: 16:00:00 1000 mL via INTRAVENOUS

## 2020-06-18 MED ORDER — LORAZEPAM 2 MG/ML IJ SOLN
1 mg | INTRAMUSCULAR | 0 refills | Status: DC | PRN
Start: 2020-06-18 — End: 2020-06-22

## 2020-06-18 NOTE — H&P (View-Only)
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  Of note, he was recently admitted from 7/12?06/12/2020 for similar signs/symptoms and subsequently discharged when he felt that his symptoms had resolved and he was safe for discharge.  He also noted to me, he tried to obtain treatment at [facility], as well as, another facility but both facilities had no inpatient beds.  Therefore, he subsequently began drinking again and thus presentation to the emergency department today.  Patient is admitted to the internal medicine service for further evaluation/management of severe alcohol dependence/abuse with acute intoxication and at high risk for withdrawal seizures and/or other complications.    Consults: None    Current assessment:  ? severe alcohol dependence/abuse with acute intoxication and at high risk for withdrawal seizures and/or other complications  ? Patient describes averaging about 1/5 of fireball per day prior to admission  ? Alcohol level 120 on admission  ? Received on a 30 mg of phenobarbital in the ED, as well as, 4 mg in the ED prior to admission orders     ? 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 Hypertension  ? Prior to arrival regimen: Lisinopril 10 mg daily (held on admission to allow for room for blood pressure management with clonidine/withdrawal signs/symptoms)  ? Elevated on admission secondary to intoxication plus/minus withdrawals    ? Hypokalemia  ? At risk for refeeding syndrome    ? Elevated AST/AST (ALP/total bilirubin within normal limits) I at baseline, chronic in nature  ? Secondary to alcohol abuse/dependence    ? Neutropenia I at baseline   ? Normocytic anemia I at baseline   ? Presumed secondary to ongoing abuse of alcohol.  Both at baseline.    ? Sinus tachycardia  ? Secondary to alcohol withdrawal    ? Reported Fall  ? X-ray of left knee on admission: No acute fracture dislocation.  Joint space well maintained.  Normal alignment.  ? X-ray of left elbow on admission: In process in regards to final read  ? CT head on admission: No acute intracranial hemorrhage or mass-effect.  ? CT cervical spine admission: No acute cervical spine fracture or subluxation.  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  Stable C5-C6 ankylosis.    Lab/imaging/medications reviewed    Current plan:  ?BZD sparing protocol (clonidine taper, gabapentin taper), as well as, IV Ativan taper for breakthrough  ?Continue to monitor for complications of withdrawal (including seizures (carcinoid))  ?Thiamine, folic acid  ?IV fluids  ?Monitor electrolytes and replace as needed; monitor for refeeding syndrome    Disposition/discharge plan: TBD        FEN: no IVF, electrolytes reviewed, No diet orders on file   VTE Prophalyxis:  Lovenox  Disposition: Admit to inpatient Med Private O- 2nd Round 4296  Code status: Full Code (Discussed on 06/18/2020)        Ezzard Flax, MD       06/18/2020  Internal Medicine         Available on [facility]- 2nd Round 4296          _______________________________________________________________________________________________________________________________________________    Chief Complaint:  Fall (fall on 7/15, possibly related to a seizure. pt unable to remember incident, but states he woke up with abrasions to face. denies blood thinners. reports chronic neck pain unchanged from baseline. pt seeking eval for fear of another seizure and because he drank a fifth of whiskey today. hx alcohol abuse with withdrawal seizures) and Alcohol Problem      History of Present Illness     Daniel French is a 49 y.o. male in addition to the above, patient noted that he felt like his heart was racing/beating out of his chest, experiencing some nausea.  Also reported intermittent vomiting prior to admission.  He currently denies SI/HI and other self-harm behaviors outside of drug and alcohol.  He indicated he really needs help that he would like resources at time of discharge as a possible.  Otherwise, he denies fever/chills/rigors, chest pain, abdominal pain, GU signs/symptoms.      Review of Systems   A comprehensive 14-point ROS  was negative except for:      Review of Systems   All other systems reviewed and are negative.      Past medical history    has a past medical history of Alcoholism (HCC), Depression, Herniation of lumbar intervertebral disc, Nephrolithiasis, Pancreatitis, PTSD (post-traumatic stress disorder), and Seizure (HCC).     Past surgical history    has a past surgical history that includes Kidney stone surgery and appendectomy.     Family history     Family History   Problem Relation Age of Onset   ? Heart Attack Other    ? Hypertension Other    ? None Reported Mother    ? Hypertension Father    ? Cancer Father    ? None Reported Sister    ? None Reported Brother    ? None Reported Sister    ? None Reported Brother      Social history     Social History     Socioeconomic History   ? Marital status: Divorced     Spouse name: Not on file   ? Number of children: Not on file   ? Years of education: Not on file   ? Highest education level: Not on file   Occupational History   ? Not on file   Tobacco Use   ? Smoking status: Never Smoker   ? Smokeless tobacco: Current User     Types: Chew   Substance and Sexual Activity   ? Alcohol use: Yes     Alcohol/week: 0.0 standard drinks   ? Drug use: Yes     Types: Methamphetamines, Cocaine, Heroin     Comment: pills, a baggie of some Arnett Galindez stuff.   ? Sexual activity: Not on file   Other Topics Concern   ? Not on file   Social History Narrative    ** Merged History Encounter **           Allergies   Patient has no known allergies.    Medications     Current Facility-Administered  Medications   Medication   ? cloNIDine (CATAPRESS) tablet 0.1 mg    Followed by   ? Melene Muller ON 06/19/2020] cloNIDine HCL (CATAPRES) tablet 0.2 mg    Followed by   ? Melene Muller ON 06/22/2020] cloNIDine (CATAPRESS) tablet 0.1 mg   ? gabapentin (NEURONTIN) capsule 900 mg    Followed by   ? Melene Muller ON 06/21/2020] gabapentin (NEURONTIN) capsule 600 mg    Followed by   ? Melene Muller ON 06/23/2020] gabapentin (NEURONTIN) capsule 300 mg   ? lactated ringers infusion   ? LORazepam (ATIVAN) tablet 1 mg    Or   ? LORazepam (ATIVAN) injection 1 mg    Or   ? LORazepam (ATIVAN) injection 1 mg   ? LORazepam (ATIVAN) tablet 2 mg    Or   ? LORazepam (ATIVAN) injection 2 mg    Or   ? LORazepam (ATIVAN) injection 2 mg   ? melatonin tablet 10 mg   ? potassium chloride SR (K-DUR) tablet 60 mEq     Current Outpatient Medications   Medication Sig   ? atorvastatin (LIPITOR) 10 mg tablet Take one tablet by mouth daily.   ? diphenhydrAMINE (BENADRYL ALLERGY) 25 mg tablet Take 25 mg by mouth at bedtime as needed.   ? folic acid (FOLVITE) 1 mg tablet Take one tablet by mouth daily.   ? lisinopriL (ZESTRIL) 10 mg tablet Take one tablet by mouth daily.   ? LORazepam (ATIVAN) 0.5 mg tablet Take one tablet by mouth every 4 hours as needed for Other... (agitation, anxiety, withdrawal symptoms) for up to 3 doses.   ? thiamine (VITAMIN B-1) 100 mg tablet Take one tablet by mouth daily.          Physical exam     BP: 148/86 (07/20 0949)  Temp: 36.9 ?C (98.4 ?F) (07/20 1610)  Pulse: 88 (07/20 0949)  Respirations: 16 PER MINUTE (07/20 0949)  SpO2: 97 % (07/20 0949)  SpO2 Pulse: 88 (07/20 0949) BP: (148-168)/(71-97)   Temp:  [36.7 ?C (98 ?F)-36.9 ?C (98.4 ?F)]   Pulse:  [83-88]   Respirations:  [14 PER MINUTE-18 PER MINUTE]   SpO2:  [97 %-100 %]   Vitals:    06/18/20 0022   Weight: 90.7 kg (200 lb)        General:  Alert and response to question appropriately.  VS as above. No acute distress .     Eyes: No conjunctival injection.  PERRLA.   No scleral icterus.    Ears, nose, mouth, and throat : No erythema.   MMM.   No erythema, exudate noted.    Neck:  Symmetric appearance without crepitus, no obvious mass or noticeable swelling,   Lungs:  No use of accessory muscles.  Clear to auscultation bilaterally without wheezes, rales, or rhonchi.   Cardiovascular: tachycardic, S1, S2 normal, no murmurs, clicks, rubs, or gallops appreciated .  2+ and symmetric, all extremities.  No edema in BLE.   Abdomen:  BS+, soft, no guarding or rigidity.  Nontender to palpation without palpable masses.  No rebound tenderness.   Skin: Skin color normal, no obvious evidence of rashes.   Musculoskeletal:  Normal 5/5  hand-grip strength and  distal strength in BLE.  ROM normal. No joints swelling or erythema.   Neurologic:  Alerted and oriented .  5/5 hand-grip and distal strenght. Normal ROM.  No  Sensation grossly intact.  No focal weakness.  No gross tremors and other age-related signs/symptoms noted however, he had quite  a bit of phenobarbital prior to this exam.  Psych:  Alerted , calm, normal affect    Labs        Recent Labs     06/18/20  0736   NA 141   K 3.3*   CL 101   CO2 26   BUN 11   CR 0.76   GAP 14*   GFR >60   ALBUMIN 3.9   TOTBILI 0.9   AST 78*   ALT 68*    Recent Labs     06/18/20  0736   WBC 3.0*   HGB 13.1*   HCT 37.6*   PLTCT 256   MCV 95.6          Radiology and Other Diagnostic Procedures Review

## 2020-06-18 NOTE — ED Notes
49 year old male presents to ED16 c/o alcohol problem. Pt reports that he's had a bad couple of months. Pt reports that he tried detoxing, but had a seizure the other day and then decided to start drinking again. Pt reports I know I need to stop, but I'm scared I'm going to have a bad detox. Pt reports that he's been drinking for two months. Pt reports drinking atleast a fifth of fireball a day. Associated symptoms include shakes, tremors, tachycardic, sweats, N/V, chills. Pt denies CP, SOB, diarrhea, constipation, and/or fever. Pt reports that he has a good support system at home, but they're not happy with my drinking. Pt reports that he fell a few days ago, but he's not concerned about that. Pt reports main concern is seizures and detoxing.    Medical History:   Diagnosis Date   ? Alcoholism (HCC)    ? Depression    ? Herniation of lumbar intervertebral disc    ? Nephrolithiasis    ? Pancreatitis    ? PTSD (post-traumatic stress disorder)    ? Seizure (HCC)        Pt is A&Ox4, calm and cooperative. Skin in warm and dry. Respirations even and unlabored. Pt resting in bed, locked in lowest position, side rails up, call light within reach. Pt placed on BP/O2/tele monitors upon arrival to room. VSS.     Belongings: Shirt, hat, jeans, underwear, socks, tennis shoes

## 2020-06-19 ENCOUNTER — Encounter: Admit: 2020-06-19 | Discharge: 2020-06-19 | Primary: Family

## 2020-06-19 MED ORDER — COVID-19 VACC,MRNA(MODERNA)-PF 100 MCG/0.5 ML IM SUSP
100 ug | INTRAMUSCULAR | 0 refills | Status: CP
Start: 2020-06-19 — End: ?
  Administered 2020-06-23: 18:00:00 100 ug via INTRAMUSCULAR

## 2020-06-19 MED ORDER — MAGNESIUM SULFATE IN D5W 1 GRAM/100 ML IV PGBK
1 g | INTRAVENOUS | 0 refills | Status: CP
Start: 2020-06-19 — End: ?
  Administered 2020-06-19: 1 g via INTRAVENOUS

## 2020-06-19 MED ORDER — MAGNESIUM SULFATE IN D5W 1 GRAM/100 ML IV PGBK
1 g | INTRAVENOUS | 0 refills | Status: CP
Start: 2020-06-19 — End: ?

## 2020-06-19 MED ORDER — MAGNESIUM SULFATE IN D5W 1 GRAM/100 ML IV PGBK
1 g | INTRAVENOUS | 0 refills | Status: CP
Start: 2020-06-19 — End: ?
  Administered 2020-06-19: 19:00:00 1 g via INTRAVENOUS

## 2020-06-19 MED ORDER — POTASSIUM CHLORIDE 20 MEQ PO TBTQ
40 meq | Freq: Once | ORAL | 0 refills | Status: CP
Start: 2020-06-19 — End: ?
  Administered 2020-06-19: 15:00:00 40 meq via ORAL

## 2020-06-19 MED ORDER — MAGNESIUM SULFATE IN D5W 1 GRAM/100 ML IV PGBK
1 g | INTRAVENOUS | 0 refills | Status: CP
Start: 2020-06-19 — End: ?
  Administered 2020-06-20: 05:00:00 1 g via INTRAVENOUS

## 2020-06-19 MED ORDER — TRAZODONE 50 MG PO TAB
50 mg | Freq: Every evening | ORAL | 0 refills | Status: DC | PRN
Start: 2020-06-19 — End: 2020-06-23

## 2020-06-19 MED ADMIN — SODIUM CHLORIDE 0.9 % IV SOLP [27838]: 250 mL | INTRAVENOUS | @ 15:00:00 | Stop: 2020-06-19 | NDC 00338004902

## 2020-06-19 NOTE — ED Notes
REPORT TO 15 RN

## 2020-06-19 NOTE — ED Notes
PT RESTING, CONNECTED TO BP/SPO2/CARDIAC MONITORING DEVICES, NO TREMORING NOTED, NO DIAPHORESIS OBSERVED.

## 2020-06-21 ENCOUNTER — Encounter: Admit: 2020-06-21 | Discharge: 2020-06-21 | Payer: PRIVATE HEALTH INSURANCE | Primary: Family

## 2020-06-21 MED ORDER — NICOTINE (POLACRILEX) 4 MG BU GUM
4 mg | BUCCAL | 0 refills | Status: DC | PRN
Start: 2020-06-21 — End: 2020-06-23
  Administered 2020-06-21: 21:00:00 4 mg via BUCCAL

## 2020-06-21 MED FILL — LISINOPRIL 10 MG PO TAB: 10 mg | ORAL | 90 days supply | Qty: 90 | Fill #1 | Status: CN

## 2020-06-23 ENCOUNTER — Encounter: Admit: 2020-06-23 | Discharge: 2020-06-23 | Payer: PRIVATE HEALTH INSURANCE | Primary: Family

## 2020-06-23 MED ORDER — LISINOPRIL 10 MG PO TAB
10 mg | ORAL_TABLET | Freq: Every day | ORAL | 0 refills | Status: AC
Start: 2020-06-23 — End: ?
  Filled 2020-06-23: qty 30, 30d supply, fill #1

## 2020-06-23 MED ORDER — TRAZODONE 50 MG PO TAB
50 mg | ORAL_TABLET | Freq: Every evening | ORAL | 0 refills | Status: AC | PRN
Start: 2020-06-23 — End: ?
  Filled 2020-06-23: qty 30, 30d supply, fill #1

## 2020-06-23 MED ORDER — ATORVASTATIN 10 MG PO TAB
10 mg | ORAL_TABLET | Freq: Every day | ORAL | 0 refills | Status: AC
Start: 2020-06-23 — End: ?
  Filled 2020-06-23: qty 30, 30d supply, fill #1

## 2020-06-23 MED ORDER — CLONIDINE HCL 0.1 MG PO TAB
.1 mg | ORAL_TABLET | Freq: Two times a day (BID) | ORAL | 0 refills | Status: AC
Start: 2020-06-23 — End: ?
  Filled 2020-06-23: qty 4, 2d supply, fill #1

## 2020-06-23 MED ORDER — GABAPENTIN 300 MG PO CAP
300 mg | ORAL_CAPSULE | ORAL | 0 refills | Status: AC
Start: 2020-06-23 — End: ?
  Filled 2020-06-23: qty 9, 3d supply, fill #1

## 2020-06-26 ENCOUNTER — Encounter: Admit: 2020-06-26 | Discharge: 2020-06-26 | Primary: Family

## 2020-07-02 ENCOUNTER — Encounter: Admit: 2020-07-02 | Discharge: 2020-07-02 | Payer: PRIVATE HEALTH INSURANCE | Primary: Family

## 2020-07-02 MED ORDER — DEXTROAMPHETAMINE-AMPHETAMINE 20 MG PO CP24
20 mg | ORAL_CAPSULE | Freq: Every morning | ORAL | 0 refills | Status: AC
Start: 2020-07-02 — End: ?

## 2020-07-02 MED ORDER — FLUOXETINE 20 MG PO TAB
20 mg | ORAL_TABLET | Freq: Every day | ORAL | 2 refills | Status: AC
Start: 2020-07-02 — End: ?
  Filled 2020-07-02: qty 30, 30d supply, fill #1

## 2020-07-02 MED ORDER — LISINOPRIL 20 MG PO TAB
20 mg | ORAL_TABLET | Freq: Every day | ORAL | 1 refills | Status: AC
Start: 2020-07-02 — End: ?
  Filled 2020-07-02: qty 90, 90d supply, fill #1

## 2020-07-02 MED ORDER — DEXTROAMPHETAMINE-AMPHETAMINE 10 MG PO TAB
10 mg | ORAL_TABLET | ORAL | 0 refills | Status: AC | PRN
Start: 2020-07-02 — End: ?
  Filled 2020-07-02 (×2): qty 30, 30d supply, fill #1

## 2020-07-06 IMAGING — CR CHEST
1 series · 1 of 1 positions shown · non-contrast
Comparison: none

[chest port x-wise]
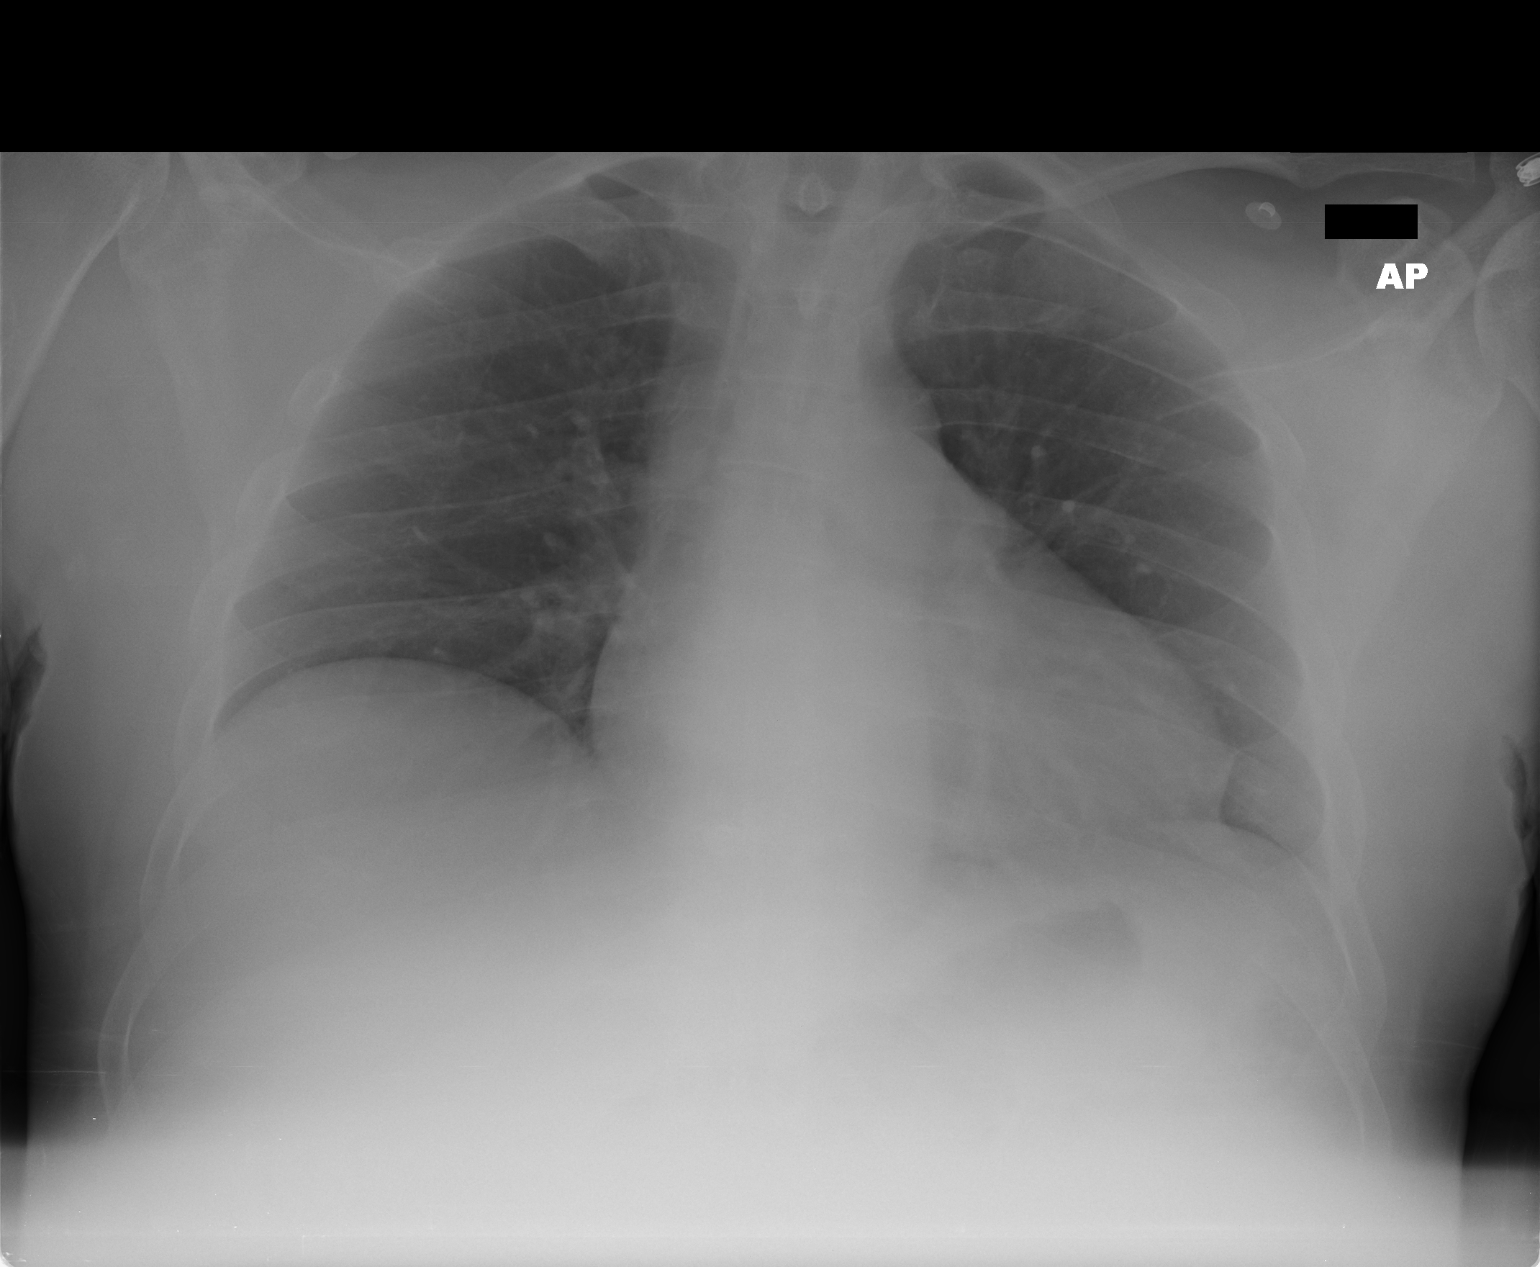

[1 of 1 positions shown; findings below may reference images not displayed]

03/10/19

DIAGNOSTIC STUDIES

EXAM

Chest radiograph.

INDICATION

extremely intoxicated
syncope. altered mental status. bg/tb

TECHNIQUE

AP chest.

COMPARISONS

None.

FINDINGS

Low lung volumes. The cardiomediastinal silhouette is nonenlarged. No consolidative airspace
disease, pleural effusion, or pneumothorax.

IMPRESSION

No acute cardiopulmonary pathology.

Tech Notes:

syncope. altered mental status. bg/tb

## 2020-07-06 IMAGING — CT BRAIN WO(Adult)
3 of 8 series · 8 of 25 positions shown, 9 images · non-contrast
Comparison: none

[Series 12: brain cor 5.00 hr40 s3 · coronal · 0.17mm/px · 3 of 8 slices shown]
[im 2/8  brain]
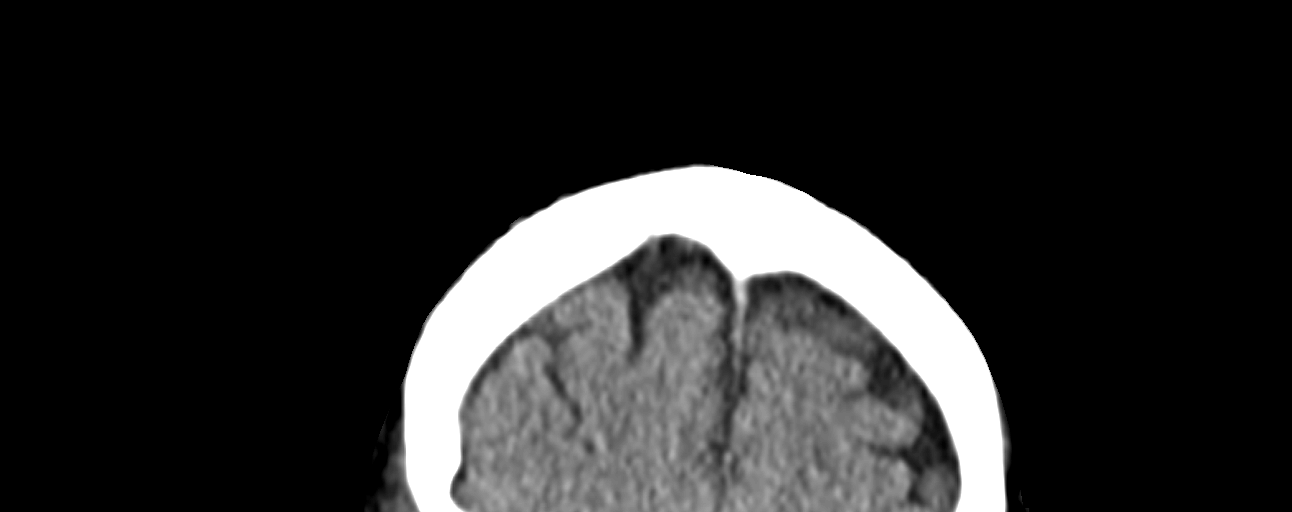
[im 3/8  brain]
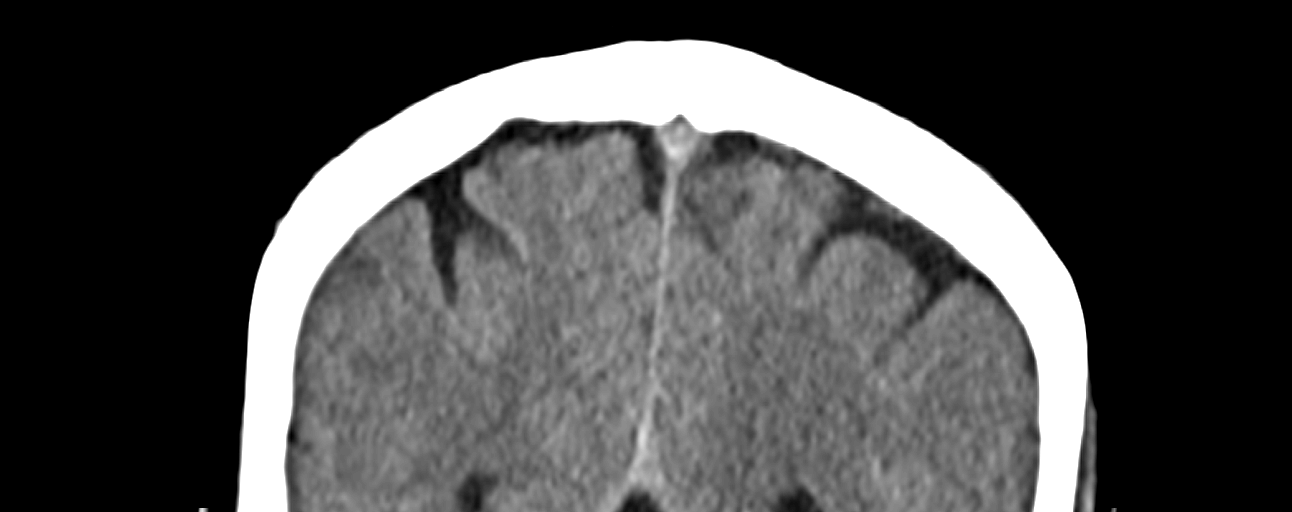
[im 5/8  brain]
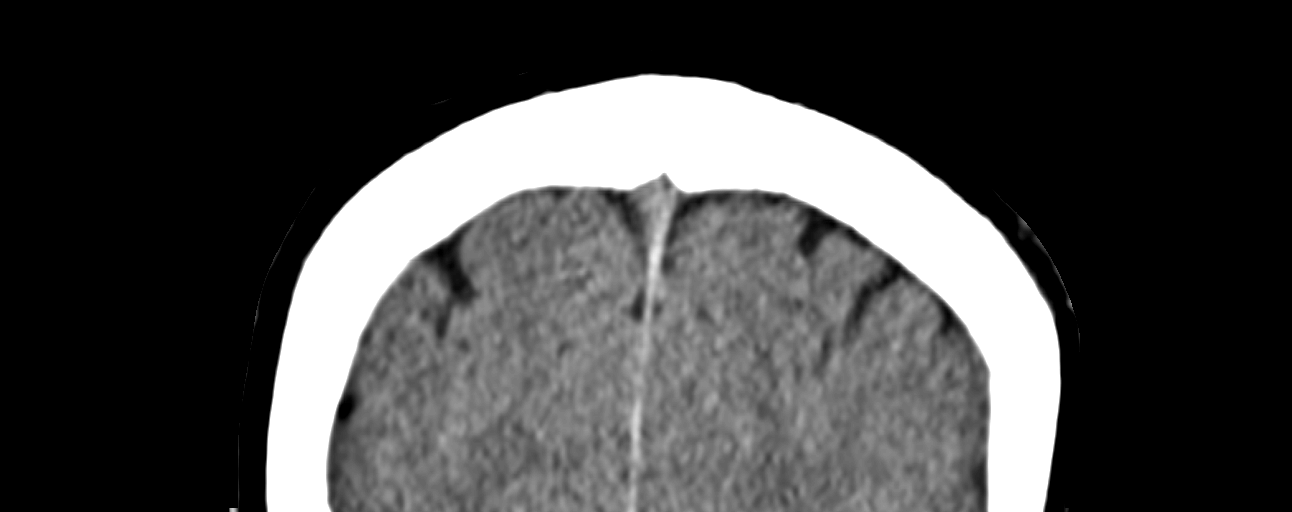

[Series 14: brain sag 5.00 hr40 s3 · sagittal · 0.17mm/px · 3 of 10 slices shown]
[im 3/10  brain]
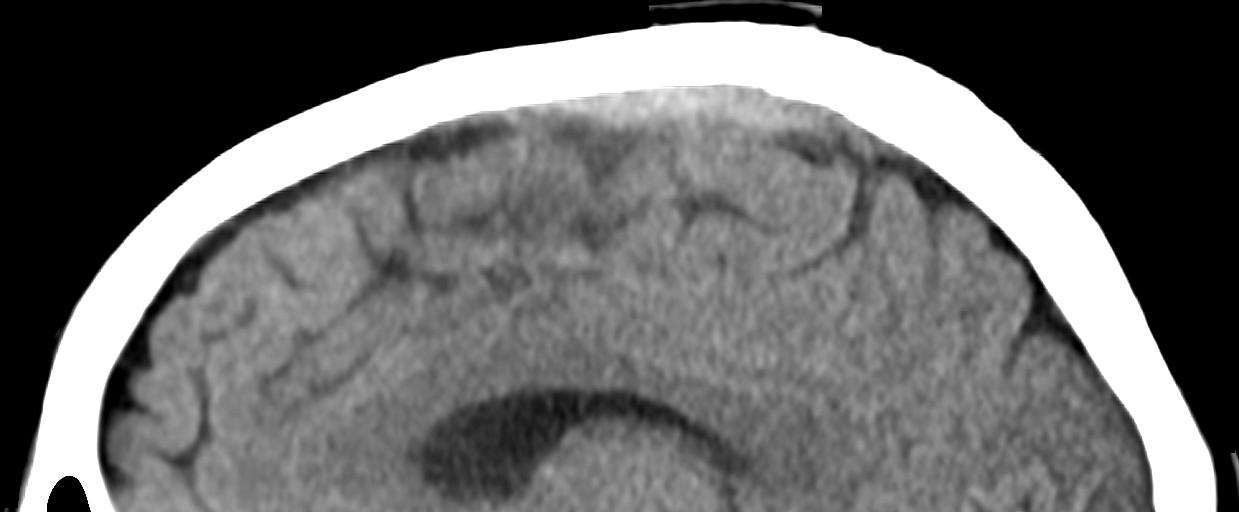
[im 5/10  brain]
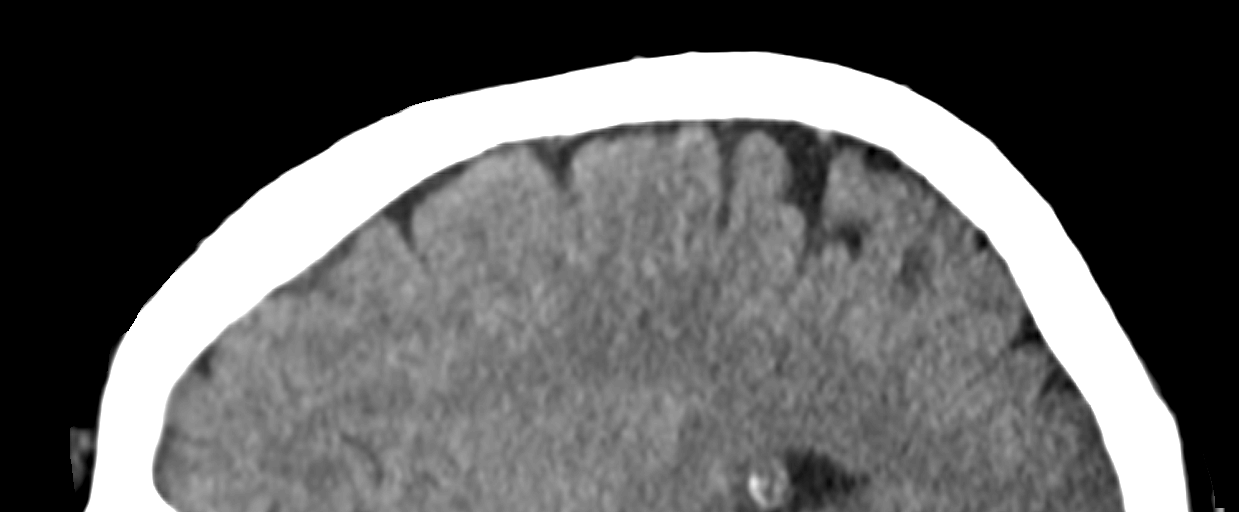
[im 7/10  brain]
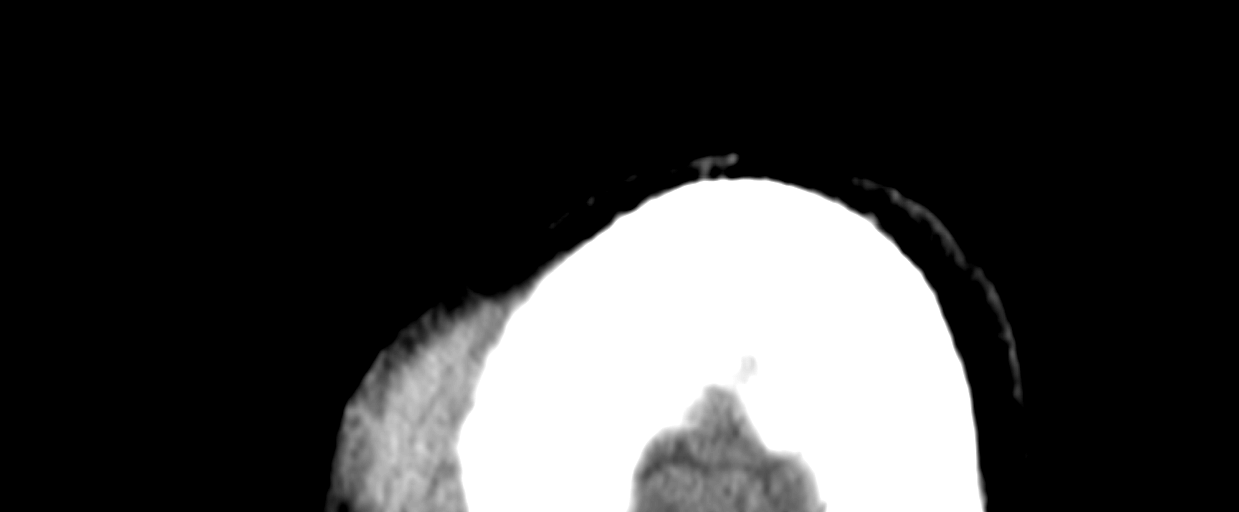

[Series 16: brain ax 5.00 hr60 s3 · axial · 0.33mm/px · z∈[-542,-532]mm · 2 of 4 slices shown, 3 images]
[im 2/4  brain]
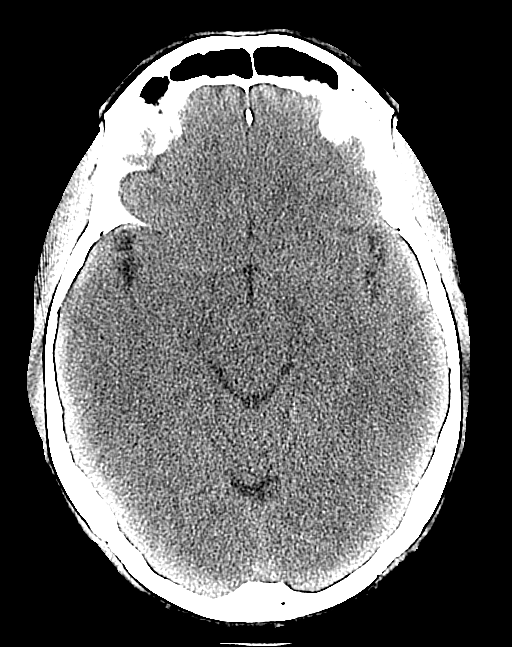
[im 2/4  bone]
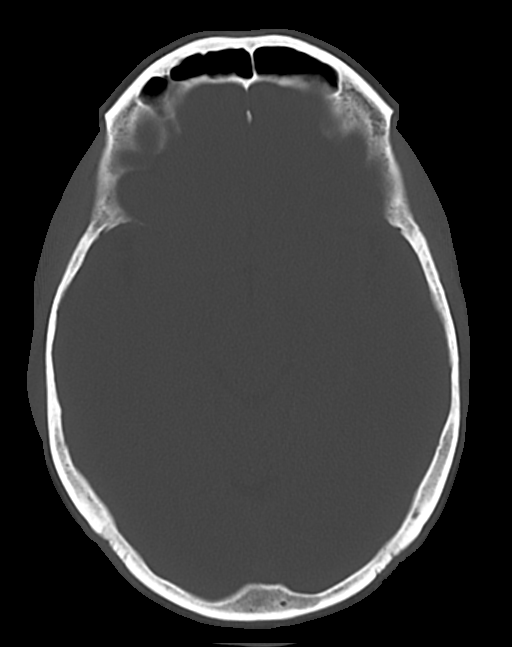
[im 3/4  brain]
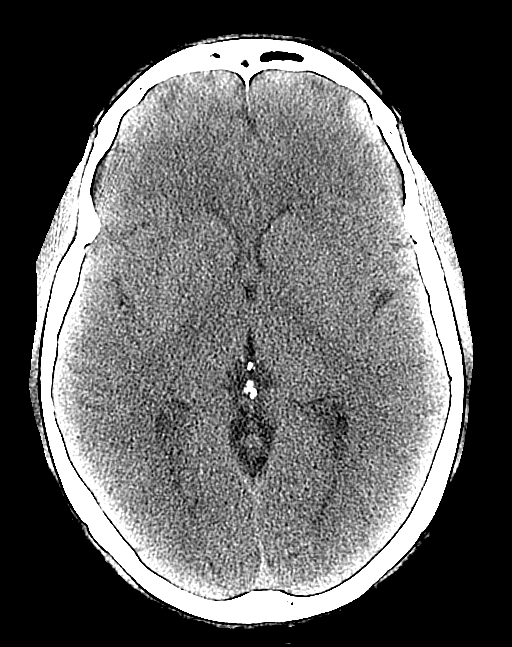

[8 of 25 positions shown; findings below may reference images not displayed]

03/10/19

EXAM

Head CT.

INDICATION

decreased LOC
0 ct /0 nm. Syncope. Altered mental status. Pt would not hold still during scan. BG/TB/ME

TECHNIQUE

Noncontrast CT scan of the head. No prior CT scans or myocardial perfusion scans in the past year.
All CT scans at this facility use dose modulation, iterative reconstruction, and/or weight based
dosing when appropriate to reduce radiation dose to as low as reasonably achievable.

COMPARISONS

None

FINDINGS

Note that examination was partially repeated due to motion artifact. There is no intra-axial or
extra-axial hemorrhage. No midline shift. No mass effect. Gray-white differentiation is preserved.
The lateral ventricles and other CSF spaces are symmetric in size and are age appropriate. The
paranasal sinuses and mastoid air cells are clear. The orbits are symmetric.

IMPRESSION

There is no evidence of acute intracranial pathology.

Tech Notes:

0 ct /0 nm. Syncope. Altered mental status. Pt would not hold still during scan. BG/TB/ME

## 2020-07-31 ENCOUNTER — Encounter: Admit: 2020-07-31 | Discharge: 2020-07-31 | Payer: PRIVATE HEALTH INSURANCE | Primary: Family

## 2020-08-01 ENCOUNTER — Encounter: Admit: 2020-08-01 | Discharge: 2020-08-01 | Payer: PRIVATE HEALTH INSURANCE | Primary: Family

## 2020-08-01 MED ORDER — ATORVASTATIN 10 MG PO TAB
10 mg | ORAL_TABLET | Freq: Every day | ORAL | 1 refills | Status: AC
Start: 2020-08-01 — End: ?
  Filled 2020-08-01: qty 90, 30d supply, fill #1

## 2020-08-13 ENCOUNTER — Encounter: Admit: 2020-08-13 | Discharge: 2020-08-13 | Primary: Family

## 2020-08-29 ENCOUNTER — Encounter: Admit: 2020-08-29 | Discharge: 2020-08-29 | Payer: PRIVATE HEALTH INSURANCE | Primary: Family

## 2020-08-29 MED ORDER — DEXTROAMPHETAMINE-AMPHETAMINE 30 MG PO TAB
30 mg | ORAL_TABLET | Freq: Two times a day (BID) | ORAL | 0 refills | Status: AC
Start: 2020-08-29 — End: ?
  Filled 2020-08-29: qty 60, 30d supply, fill #1

## 2020-08-29 MED ORDER — DEXTROAMPHETAMINE-AMPHETAMINE 10 MG PO TAB
10 mg | ORAL_TABLET | Freq: Every evening | ORAL | 0 refills | Status: AC
Start: 2020-08-29 — End: ?
  Filled 2020-08-29: qty 15, 30d supply, fill #1

## 2020-09-04 ENCOUNTER — Encounter: Admit: 2020-09-04 | Discharge: 2020-09-04 | Primary: Family

## 2020-09-06 ENCOUNTER — Encounter: Admit: 2020-09-06 | Discharge: 2020-09-06 | Primary: Family

## 2020-09-30 ENCOUNTER — Encounter: Admit: 2020-09-30 | Discharge: 2020-09-30 | Primary: Family

## 2020-09-30 MED ORDER — TRAZODONE 100 MG PO TAB
ORAL_TABLET | Freq: Every evening | 1 refills | PRN
Start: 2020-09-30 — End: ?

## 2020-10-06 ENCOUNTER — Encounter: Admit: 2020-10-06 | Discharge: 2020-10-06 | Primary: Family

## 2020-10-06 ENCOUNTER — Emergency Department: Admit: 2020-10-06 | Discharge: 2020-10-06

## 2020-10-06 DIAGNOSIS — R319 Hematuria, unspecified: Secondary | ICD-10-CM

## 2020-10-06 DIAGNOSIS — T07XXXA Unspecified multiple injuries, initial encounter: Secondary | ICD-10-CM

## 2020-10-06 DIAGNOSIS — F10229 Alcohol dependence with intoxication, unspecified: Secondary | ICD-10-CM

## 2020-10-06 MED ORDER — PHENOBARBITAL SODIUM 130 MG/ML IJ SOLN
130 mg | INTRAVENOUS | 0 refills | Status: AC | PRN
Start: 2020-10-06 — End: ?
  Administered 2020-10-07: 04:00:00 130 mg via INTRAVENOUS

## 2020-10-06 MED ORDER — LORAZEPAM 2 MG/ML IJ SOLN
2 mg | Freq: Once | INTRAVENOUS | 0 refills | Status: AC
Start: 2020-10-06 — End: ?

## 2020-10-06 MED ORDER — LACTATED RINGERS IV SOLP
1000 mL | INTRAVENOUS | 0 refills | Status: CP
Start: 2020-10-06 — End: ?
  Administered 2020-10-07: 02:00:00 1000 mL via INTRAVENOUS

## 2020-10-06 MED ORDER — LORAZEPAM 1 MG PO TAB
1 mg | Freq: Once | ORAL | 0 refills | Status: CP
Start: 2020-10-06 — End: ?
  Administered 2020-10-07: 05:00:00 1 mg via ORAL

## 2020-10-06 MED ORDER — THIAMINE/FOLIC ACID IVPB
Freq: Once | INTRAVENOUS | 0 refills | Status: CP
Start: 2020-10-06 — End: ?
  Administered 2020-10-07 (×3): 50.000 mL via INTRAVENOUS

## 2020-10-06 MED ORDER — SODIUM CHLORIDE 0.9 % IV SOLP
1000 mL | Freq: Once | INTRAVENOUS | 0 refills | Status: CP
Start: 2020-10-06 — End: ?
  Administered 2020-10-07: 04:00:00 1000 mL via INTRAVENOUS

## 2020-10-06 MED ORDER — IOHEXOL 350 MG IODINE/ML IV SOLN
100 mL | Freq: Once | INTRAVENOUS | 0 refills | Status: CP
Start: 2020-10-06 — End: ?
  Administered 2020-10-07: 06:00:00 100 mL via INTRAVENOUS

## 2020-10-06 MED ORDER — SODIUM CHLORIDE 0.9 % IJ SOLN
50 mL | Freq: Once | INTRAVENOUS | 0 refills | Status: CP
Start: 2020-10-06 — End: ?
  Administered 2020-10-07: 06:00:00 50 mL via INTRAVENOUS

## 2020-10-06 MED ORDER — ACETAMINOPHEN 325 MG PO TAB
650 mg | ORAL | 0 refills | Status: AC | PRN
Start: 2020-10-06 — End: ?
  Administered 2020-10-07 (×2): 650 mg via ORAL

## 2020-10-06 MED ORDER — DIPHTH,PERTUS(ACELL),TETANUS 2.5-8-5 LF-MCG-LF/0.5ML IM SUSP
.5 mL | Freq: Once | INTRAMUSCULAR | 0 refills | Status: CP
Start: 2020-10-06 — End: ?
  Administered 2020-10-07: 01:00:00 0.5 mL via INTRAMUSCULAR

## 2020-10-06 MED ORDER — KETOROLAC 15 MG/ML IJ SOLN
15 mg | Freq: Once | INTRAVENOUS | 0 refills | Status: CP
Start: 2020-10-06 — End: ?
  Administered 2020-10-07: 04:00:00 15 mg via INTRAVENOUS

## 2020-10-06 MED ORDER — LIDOCAINE 5 % TP PTMD
1 | Freq: Once | TOPICAL | 0 refills | Status: AC
Start: 2020-10-06 — End: ?
  Administered 2020-10-07: 04:00:00 1 via TOPICAL

## 2020-10-06 NOTE — ED Notes
Pt brought to ED via EMS for inability to walk d/t alcohol intoxication. A bystander called 911 for someone naked and covered in blood. Upon EMS arrival on scene, pt was neither naked nor covered in blood. Pt has abrasions to bilateral knees and left cheek. Bruising in various stages of healing. Scabbed abrasion to lower back. Blistered abrasions to left posterior/inner hand. 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 States he drank a lot today. Gave permission for staff to contact his family. Reports he lives with his mother, but only his brother's number is on file. EMS reports pt has been transported to the hospital every day for the last 5 days. Pt incontinent of urine upon arrival. Initially, pt stated he did not want to harm himself, but shortly after told APRN Moffat he wanted to harm himself. Pt already in a yellow gown for safety watch. Charge RN notified of need for a CO. Respirations even and unlabored. NAD.     Belongings:    1 shirt  1 jacket  1 pair of pants  1 pair of underwear - wet  1 pair of socks  1 pair of shoes    All belongings placed in 2 white belongings bags labeled with patient stickers and taken to the nurses' station.

## 2020-10-06 NOTE — ED Provider Notes
Daniel French is a 49 y.o. male.    Chief Complaint:  Chief Complaint   Patient presents with   ? Alcohol intoxication     Unable to ambulate home. Passerby called. Pt has been transported for drunkeness for the last 5 days according to EMS.       History of Present Illness:  Daniel French is a 49 y.o. male with medical history of alcoholism, pancreatitis, PTSD, kidney stones, alcohol withdrawal seizures,  that arrived to the ED via [facility]. Pt was found by a passerby and appeared intoxicated. Per EMS, pt has had multiple ER visits over the past 5 days due to alcohol intoxication, most recently to [facility] ER. Pt is a poor historian and repeatedly falls asleep during exam. Pt answers yes to suicidal ideations but denies having a plan. Pt with multiple abrasions to face, back, left hand, bilateral knees. Pt denies any home medications. Endorses alcohol use, denies illicit drug use. States he lives with his mother.               Review of Systems:  Review of Systems   Unable to perform ROS: Mental status change (GCS 14, falls asleep during exam, ROS limited )   Cardiovascular: Negative for chest pain.   Gastrointestinal: Negative for abdominal pain.   Skin: Positive for wound.   Neurological: Negative for headaches.   Psychiatric/Behavioral: Positive for suicidal ideas.       Allergies:  Patient has no known allergies.    Past Medical History:  Medical History:   Diagnosis Date   ? Alcoholism (HCC)    ? Depression    ? Herniation of lumbar intervertebral disc    ? Nephrolithiasis    ? Pancreatitis    ? PTSD (post-traumatic stress disorder)    ? Seizure [facility])        Past Surgical History:  Surgical History:   Procedure Laterality Date   ? HX APPENDECTOMY     ? KIDNEY STONE SURGERY         Pertinent medical/surgical history reviewed    Social History:  Social History     Tobacco Use   ? Smoking status: Never Smoker   ? Smokeless tobacco: Current User     Types: Chew   Vaping Use   ? Vaping Use: Never used Substance Use Topics   ? Alcohol use: Yes     Alcohol/week: 0.0 standard drinks   ? Drug use: Yes     Types: Methamphetamines, Cocaine, Heroin     Comment: pills, a baggie of some brown stuff.     Social History     Substance and Sexual Activity   Drug Use Yes   ? Types: Methamphetamines, Cocaine, Heroin    Comment: pills, a baggie of some brown stuff.             Family History:  Family History   Problem Relation Age of Onset   ? Heart Attack Other    ? Hypertension Other    ? None Reported Mother    ? Hypertension Father    ? Cancer Father    ? None Reported Sister    ? None Reported Brother    ? None Reported Sister    ? None Reported Brother        Vitals:  ED Vitals    Date and Time T BP P RR SPO2P SPO2 User   10/06/20 2300 -- 101/55 82 20 PER MINUTE 72 94 %  MN   10/06/20 2200 37.2 ?C (99 ?F) 130/75 93 22 PER MINUTE 95 98 % MN   10/06/20 2100 -- -- 80 20 PER MINUTE 80 96 % MN   10/06/20 1900 -- 106/59 82 22 PER MINUTE 76 96 % CP   10/06/20 1830 -- 107/60 71 20 PER MINUTE 69 98 % CP   10/06/20 1800 -- 125/78 -- -- -- -- CP   10/06/20 1739 -- -- 78 -- 78 98 % CP   10/06/20 1737 37 ?C (98.6 ?F) -- -- -- -- -- CP   10/06/20 1736 -- -- -- -- 89 94 % CP   10/06/20 1734 -- 116/91 -- -- -- -- CP          Physical Exam:  Physical Exam  Vitals and nursing note reviewed.   Constitutional:       General: He is not in acute distress.     Appearance: He is not ill-appearing.   HENT:      Head: Normocephalic.      Comments: Abrasions to face      Right Ear: Tympanic membrane normal.      Left Ear: Tympanic membrane normal.      Nose: Nose normal.      Mouth/Throat:      Mouth: Mucous membranes are moist.   Eyes:      Extraocular Movements: Extraocular movements intact.      Conjunctiva/sclera: Conjunctivae normal.      Pupils: Pupils are equal, round, and reactive to light.      Comments: Pupils 5mm and reactive    Cardiovascular:      Rate and Rhythm: Normal rate.      Pulses: Normal pulses.      Heart sounds: Normal heart sounds.   Pulmonary:      Effort: Pulmonary effort is normal.      Breath sounds: Normal breath sounds.   Abdominal:      General: Abdomen is flat.      Palpations: Abdomen is soft.   Musculoskeletal:         General: Tenderness (left hand, abrasions) present. Normal range of motion.      Cervical back: Neck supple. No tenderness.   Skin:     General: Skin is warm and dry.      Capillary Refill: Capillary refill takes less than 2 seconds.      Comments: Abrasions to bilateral knees, lower back (appears old and in various stages of healing), left hand abrasion (in various stages of healing)    Neurological:      GCS: GCS eye subscore is 3. GCS verbal subscore is 5. GCS motor subscore is 6.      Cranial Nerves: No cranial nerve deficit.      Sensory: No sensory deficit.      Motor: Motor function is intact.      Comments: GCS 13  Falls asleep during exam. Responds to verbal and painful stimuli          Laboratory Results:  Labs Reviewed   CBC AND DIFF - Abnormal       Result Value Ref Range Status    White Blood Cells 12.4 (*) 4.5 - 11.0 K/UL Final    RBC 4.18 (*) 4.4 - 5.5 M/UL Final    Hemoglobin 12.9 (*) 13.5 - 16.5 GM/DL Final    Hematocrit 78.4 (*) 40 - 50 % Final    MCV 91.5  80 - 100 FL Final  MCH 30.9  26 - 34 PG Final    MCHC 33.8  32.0 - 36.0 G/DL Final    RDW 16.1  11 - 15 % Final    Platelet Count 328  150 - 400 K/UL Final    MPV 6.9 (*) 7 - 11 FL Final    Neutrophils 87 (*) 41 - 77 % Final    Lymphocytes 6 (*) 24 - 44 % Final    Monocytes 7  4 - 12 % Final    Eosinophils 0  0 - 5 % Final    Basophils 0  0 - 2 % Final    Absolute Neutrophil Count 10.81 (*) 1.8 - 7.0 K/UL Final    Absolute Lymph Count 0.78 (*) 1.0 - 4.8 K/UL Final    Absolute Monocyte Count 0.82 (*) 0 - 0.80 K/UL Final    Absolute Eosinophil Count 0.01  0 - 0.45 K/UL Final    Absolute Basophil Count 0.02  0 - 0.20 K/UL Final    MDW (Monocyte Distribution Width) 23.2 (*) <20.7 Final   COMPREHENSIVE METABOLIC PANEL - Abnormal Sodium 145  137 - 147 MMOL/L Final    Potassium 4.4  3.5 - 5.1 MMOL/L Final    Chloride 102  98 - 110 MMOL/L Final    Glucose 103 (*) 70 - 100 MG/DL Final    Blood Urea Nitrogen 14  7 - 25 MG/DL Final    Creatinine 0.96  0.4 - 1.24 MG/DL Final    Calcium 8.6  8.5 - 10.6 MG/DL Final    Total Protein 6.8  6.0 - 8.0 G/DL Final    Total Bilirubin 0.7  0.3 - 1.2 MG/DL Final    Albumin 4.4  3.5 - 5.0 G/DL Final    Alk Phosphatase 78  25 - 110 U/L Final    AST (SGOT) 43 (*) 7 - 40 U/L Final    CO2 26  21 - 30 MMOL/L Final    ALT (SGPT) 46  7 - 56 U/L Final    Anion Gap 17 (*) 3 - 12 Final    eGFR Non African American >60  >60 mL/min Final    eGFR African American >60  >60 mL/min Final   URINALYSIS DIPSTICK REFLEX TO CULTURE - Abnormal    Color,UA YELLOW   Final    Turbidity,UA CLEAR  CLEAR-CLEAR Final    Specific Gravity-Urine 1.015  1.005 - 1.030 Final    pH,UA 6.0  5.0 - 8.0 Final    Protein,UA 1+ (*) NEG-NEG Final    Glucose,UA NEG  NEG-NEG Final    Ketones,UA TRACE (*) NEG-NEG Final    Bilirubin,UA NEG  NEG-NEG Final    Blood,UA 3+ (*) NEG-NEG Final    Urobilinogen,UA INCREASED (*) NORM-NORMAL Final    Nitrite,UA NEG  NEG-NEG Final    Leukocytes,UA NEG  NEG-NEG Final    Urine Ascorbic Acid, UA NEG  NEG-NEG Final   POC GLUCOSE - Abnormal    Glucose, POC 107 (*) 70 - 100 MG/DL Final   CREATINE KINASE-CPK - Abnormal    Creatine Kinase 502 (*) 35 - 232 U/L Final   TSH WITH FREE T4 REFLEX - Abnormal    TSH 0.31 (*) 0.35 - 5.00 MCU/ML Final   MAGNESIUM    Magnesium 1.9  1.6 - 2.6 mg/dL Final   PHOSPHORUS    Phosphorus 3.6  2.0 - 4.5 MG/DL Final   URINALYSIS MICROSCOPIC REFLEX TO CULTURE    WBCs,UA 0-2  0 - 2 /HPF Final    RBCs,UA 20-50  0 - 3 /HPF Final    Comment,UA     Final    Value: Criteria for reflex to culture are WBC>10, Positive Nitrite, and/or >=+1   leukocytes. If quantity is not sufficient, an addendum will follow.      MucousUA TRACE   Final    Squamous Epithelial Cells 0-2  0 - 5 Final   ALCOHOL LEVEL Alcohol 344  MG/DL Final   SALICYLATE LEVEL    Salicylate <2.5  2.0 - 29.0 MG/DL Final   ACETAMINOPHEN LEVEL    Acetaminophen <10.0  <20.1 MCG/ML Final   UA REFLEX LABEL   AMPHETAMINES-URINE RANDOM   BARBITURATES-URINE RANDOM   BENZODIAZEPINES-URINE RANDOM   CANNABINOIDS-URINE RANDOM   COCAINE-URINE RANDOM   OPIATES-URINE RANDOM   PHENCYCLIDINES-URINE RANDOM   CHLAM/NG PCR URINE   FREE T4-FREE THYROXINE   POC GLUCOSE     POC Glucose (Download): (!) 107    Radiology Interpretation:  CHEST SINGLE VIEW   Final Result         No acute cardiopulmonary abnormality.          Finalized by Earlyne Iba, MD, PhD on 10/06/2020 10:28 PM. Dictated by Earlyne Iba, MD, PhD on 10/06/2020 10:27 PM.         CT HEAD WO CONTRAST   Final Result         Head:       No acute intracranial hemorrhage or calvarial fracture.      Maxillofacial:       1.  Small facial contusion without evidence of acute facial fracture or orbital hemorrhage.      2.  Old left orbital floor fracture deformity.      Cervical Spine:       1.  No evidence of acute cervical spine fracture or traumatic subluxation.      2.  Cervical spondylosis as described.      3.  Congenital C5-C6 ankylosis.          Finalized by Ricky Coop, M.D. on 10/06/2020 9:24 PM. Dictated by Lawton Coop, M.D. on 10/06/2020 9:14 PM.         CT MAXIFACIAL/SINUS WO CONTRAST   Final Result         Head:       No acute intracranial hemorrhage or calvarial fracture.      Maxillofacial:       1.  Small facial contusion without evidence of acute facial fracture or orbital hemorrhage.      2.  Old left orbital floor fracture deformity.      Cervical Spine:       1.  No evidence of acute cervical spine fracture or traumatic subluxation.      2.  Cervical spondylosis as described.      3.  Congenital C5-C6 ankylosis.          Finalized by Theordore Coop, M.D. on 10/06/2020 9:24 PM. Dictated by Moe Coop, M.D. on 10/06/2020 9:14 PM.         CT SPINE CERVICAL WO CONTRAST   Final Result         Head: No acute intracranial hemorrhage or calvarial fracture.      Maxillofacial:       1.  Small facial contusion without evidence of acute facial fracture or orbital hemorrhage.      2.  Old left orbital floor fracture deformity.      Cervical Spine:  1.  No evidence of acute cervical spine fracture or traumatic subluxation.      2.  Cervical spondylosis as described.      3.  Congenital C5-C6 ankylosis.          Finalized by Fadel Coop, M.D. on 10/06/2020 9:24 PM. Dictated by Keshan Coop, M.D. on 10/06/2020 9:14 PM.         HAND MIN 3 VIEWS LEFT   Final Result         No acute osseous abnormality.          Finalized by Earlyne Iba, MD, PhD on 10/06/2020 7:10 PM. Dictated by Earlyne Iba, MD, PhD on 10/06/2020 7:09 PM.         CT ABD/PELV W CONTRAST    (Results Pending)   CT CHEST W CONTRAST    (Results Pending)           EKG:  Sinus rhythm Rate 77. No STEMI  QT 383  QTc 434    Pt c/o chest pain @ 2200. Repeat EKG ordered.   EKG unchanged   Sinus rhythm  Rate 94  QT 366  QTc 458          ED Course:  Pt evaluated by L. Reverie Vaquera APRN and Dr. Samuella Cota  Care Everywhere reviewed. Pt with multiple ED visits to [facility] 11/4-11/7 for alcohol intoxication. Blood alcohol level ranged from 248-464 mg/dL. Social work at [facility] provided patient with resources  Pt reports drinking a lot of alcohol today. Pt frequently falls asleep during exam. Pt endorses suicidal ideations and CO ordered for bedside. Multiple abrasions in various stages of healing noted to left hand and back. Abrasions noted to bilateral knees and face.   IV established and pt given thiamine 1mg  IV+ folic acid 1mg  IV as well as LR IVF bolus for hydration.   EKG obtained and reviewed. No acute process or STEMI  CT head/face/c-spine obtained and reviewed. No acute fracture  Upon re-evaluation, pt continues to fall asleep during exam. Endorses right side chest pain and CXR ordered along with repeat EKG. EKG unchanged.   CXR reviewed. Labs ordered and reviewed.   Mild leukocytosis with WBC 12.4.   Hgb stable at 12.9  Electrolytes stable. K+ 4.4, Mg 1.9  BAL 344  CK elevated at 502.   UA with 3+ blood and 20-50 RBCs. Trace ketones. No leukocytes  Pt requested ativan for withdrawal. Ativan 1mg  PO.   CT C/A/P added due to hematuria and flank pain. Phenobarb IV added PRN for alcohol withdrawal. Additional NS IVF ordered for hydration.   PLS consulted due to pt's statement of SI.   CXR shows no acute process.     2330: Pt re-evaluated and endorses flank pain and right chest pain, continues to fall asleep easily. CT scans pending.     Pt signed out to Dr. Argie Ramming pending CT scan results and PLS evaluation.                  ED Scoring:                             MDM  Reviewed: previous chart, nursing note and vitals (care everywhere )  Reviewed previous: labs  Interpretation: labs, CT scan, x-ray and ECG  Consults: PLS         Facility Administered Meds:  Medications   LORazepam (ATIVAN) injection 2 mg (2 mg Intravenous  Med Not Given 10/06/20 2148)   acetaminophen (TYLENOL) tablet 650 mg (650 mg Oral Given 10/06/20 2158)   lidocaine (LIDODERM) 5 % topical patch 1 patch (1 patch Topical Patch/Topical Applied 10/06/20 2157)   PHENobarbital injection 130 mg (130 mg Intravenous Given 10/06/20 2226)   lactated ringers infusion (1,000 mL Intravenous Given - New Bag 10/06/20 2009)   thiamine (VITAMIN B-1) 200 mg, folic acid 1 mg in sodium chloride 0.9% (NS) 50 mL IVPB ( Intravenous Infusion Stopped 10/06/20 2006)   diphtheria/tetanus/pertus(acell) booster (Tdap) (BOOSTRIX) injection 0.5 mL (0.5 mL Intramuscular Given 10/06/20 1908)   LORazepam (ATIVAN) tablet 1 mg (1 mg Oral Given 10/06/20 2230)   ketorolac (TORADOL) injection 15 mg (15 mg Intravenous Given 10/06/20 2226)   sodium chloride 0.9 %   infusion (1,000 mL Intravenous Given - New Bag 10/06/20 2229)         Clinical Impression:  Clinical Impression   Acute alcoholic intoxication in alcoholism with complication (HCC)   Hematuria, unspecified type   Abrasions of multiple sites       Disposition/Follow up  ED Disposition     None        No follow-up provider specified.    Medications:  New Prescriptions    No medications on file       Procedure Notes:  Procedures        Attestation / Supervision:        Verner Mould, APRN-NP

## 2020-10-06 NOTE — ED Notes
Upon APRN Moffat's questioning, pt shook head yes when asked if he wanted to hurt himself.

## 2020-10-07 ENCOUNTER — Encounter: Admit: 2020-10-07 | Discharge: 2020-10-07 | Primary: Family

## 2020-10-07 ENCOUNTER — Emergency Department: Admit: 2020-10-07 | Discharge: 2020-10-06

## 2020-10-07 LAB — URINALYSIS MICROSCOPIC REFLEX TO CULTURE

## 2020-10-07 LAB — PHOSPHORUS: Lab: 3.6 mg/dL — ABNORMAL LOW (ref 2.0–4.5)

## 2020-10-07 LAB — CREATINE KINASE-CPK: Lab: 502 U/L — ABNORMAL HIGH (ref 35–232)

## 2020-10-07 LAB — CBC AND DIFF
Lab: 0 10*3/uL (ref 0–0.20)
Lab: 0 10*3/uL (ref 0–0.45)
Lab: 12 10*3/uL — ABNORMAL HIGH (ref 4.5–11.0)
Lab: 23 — ABNORMAL HIGH (ref <20.7)

## 2020-10-07 LAB — TSH WITH FREE T4 REFLEX: Lab: 0.3 uU/mL — ABNORMAL LOW (ref 0.35–5.00)

## 2020-10-07 LAB — POC GLUCOSE: Lab: 107 mg/dL — ABNORMAL HIGH (ref 70–100)

## 2020-10-07 LAB — URINALYSIS DIPSTICK REFLEX TO CULTURE
Lab: NEGATIVE K/UL — ABNORMAL HIGH (ref 3–12)
Lab: NEGATIVE U/L (ref 7–56)
Lab: NEGATIVE U/L — ABNORMAL LOW (ref 25–110)
Lab: NEGATIVE mL/min — ABNORMAL LOW (ref 1.0–4.8)

## 2020-10-07 LAB — ALCOHOL LEVEL: Lab: 344 mg/dL

## 2020-10-07 LAB — SALICYLATE LEVEL

## 2020-10-07 LAB — COMPREHENSIVE METABOLIC PANEL
Lab: 145 MMOL/L — ABNORMAL LOW (ref 137–147)
Lab: >60 mL/min — ABNORMAL HIGH (ref >60)

## 2020-10-07 LAB — MAGNESIUM: Lab: 1.9 mg/dL — ABNORMAL LOW (ref 1.6–2.6)

## 2020-10-07 LAB — FREE T4-FREE THYROXINE: Lab: 1 ng/dL (ref 0.6–1.6)

## 2020-10-07 LAB — ACETAMINOPHEN LEVEL: Lab: <10 ug/mL (ref <20.1)

## 2020-10-07 NOTE — ED Notes
Report given to Michaela, RN. Care transferred at this time.

## 2020-10-07 NOTE — ED Notes
Pt reports R sided chest pain. Provider notified

## 2020-10-10 ENCOUNTER — Inpatient Hospital Stay: Admit: 2020-10-10

## 2020-10-10 ENCOUNTER — Encounter: Admit: 2020-10-10 | Discharge: 2020-10-10 | Primary: Family

## 2020-10-10 DIAGNOSIS — F10230 Alcohol dependence with withdrawal, uncomplicated: Secondary | ICD-10-CM

## 2020-10-10 LAB — COMPREHENSIVE METABOLIC PANEL
Lab: 0.5 mg/dL — ABNORMAL LOW (ref 0.3–1.2)
Lab: 101 MMOL/L — ABNORMAL LOW (ref 98–110)
Lab: 143 MMOL/L — ABNORMAL LOW (ref 137–147)
Lab: 15 K/UL — ABNORMAL HIGH (ref 3–12)
Lab: 3.3 g/dL — ABNORMAL LOW (ref 3.5–5.0)
Lab: 5.5 g/dL — ABNORMAL LOW (ref 6.0–8.0)
Lab: 50 U/L (ref 7–56)
Lab: 52 U/L — ABNORMAL HIGH (ref 7–40)
Lab: 7.9 mg/dL — ABNORMAL LOW (ref 8.5–10.6)
Lab: 73 U/L — ABNORMAL LOW (ref 25–110)
Lab: >60 mL/min (ref >60)
Lab: >60 mL/min — ABNORMAL HIGH (ref >60)

## 2020-10-10 LAB — CBC
Lab: 13 % (ref 11–15)
Lab: 201 K/UL (ref 150–400)
Lab: 28 % — ABNORMAL LOW (ref 40–50)
Lab: 3.1 M/UL — ABNORMAL LOW (ref 4.4–5.5)
Lab: 31 pg (ref 26–34)
Lab: 34 g/dL (ref 32.0–36.0)
Lab: 9.9 K/UL (ref 4.5–11.0)
Lab: 9.9 g/dL — ABNORMAL LOW (ref 13.5–16.5)
Lab: 90 FL (ref 80–100)
Lab: 13 % (ref 11–15)
Lab: 175 K/UL (ref 150–400)
Lab: 27 % — ABNORMAL LOW (ref 40–50)
Lab: 3 M/UL — ABNORMAL LOW (ref 4.4–5.5)
Lab: 32 pg (ref 26–34)
Lab: 35 g/dL (ref 32.0–36.0)
Lab: 7.2 FL (ref 7–11)
Lab: 7.3 K/UL (ref 4.5–11.0)
Lab: 9.7 g/dL — ABNORMAL LOW (ref 13.5–16.5)
Lab: 90 FL (ref 80–100)

## 2020-10-10 LAB — VITAMIN B12: Lab: 342 pg/mL (ref 180–914)

## 2020-10-10 LAB — COVID-19 (SARS-COV-2) PCR

## 2020-10-10 LAB — CBC AND DIFF
Lab: 0 % (ref 0–5)
Lab: 0 K/UL (ref 0–0.20)
Lab: 0 K/UL (ref 0–0.45)
Lab: 10 K/UL (ref 4.5–11.0)
Lab: 11 g/dL — ABNORMAL LOW (ref 13.5–16.5)
Lab: 25 — ABNORMAL HIGH (ref <20.7)
Lab: 31 pg (ref 26–34)
Lab: 34 g/dL (ref 32.0–36.0)
Lab: 89 FL — ABNORMAL HIGH (ref 80–100)

## 2020-10-10 LAB — RETICULOCYTE COUNT
Lab: 0.5 % — ABNORMAL HIGH (ref 30–300)
Lab: 0.7 % — ABNORMAL LOW (ref 0.5–2.0)
Lab: 24 K/UL — ABNORMAL LOW (ref 30–94)

## 2020-10-10 LAB — FOLATE, SERUM: Lab: 4.7 ng/mL (ref >3.9)

## 2020-10-10 LAB — POC GLUCOSE: Lab: 122 mg/dL — ABNORMAL HIGH (ref 70–100)

## 2020-10-10 LAB — IRON + BINDING CAPACITY + %SAT+ FERRITIN
Lab: 226 ug/dL — ABNORMAL LOW (ref 270–380)
Lab: 24 ug/dL — ABNORMAL LOW (ref 50–185)

## 2020-10-10 MED ORDER — LORAZEPAM 1 MG PO TAB
2 mg | ORAL | 0 refills | Status: AC | PRN
Start: 2020-10-10 — End: ?

## 2020-10-10 MED ORDER — THIAMINE 100MG IVPB
200 mg | Freq: Every day | INTRAVENOUS | 0 refills | Status: AC
Start: 2020-10-10 — End: ?

## 2020-10-10 MED ORDER — SODIUM CHLORIDE 0.9 % IV SOLP
500 mL | INTRAVENOUS | 0 refills | Status: CP
Start: 2020-10-10 — End: ?

## 2020-10-10 MED ORDER — ATORVASTATIN 10 MG PO TAB
10 mg | Freq: Every day | ORAL | 0 refills | Status: AC
Start: 2020-10-10 — End: ?
  Administered 2020-10-10 – 2020-10-11 (×2): 10 mg via ORAL

## 2020-10-10 MED ORDER — ENOXAPARIN 40 MG/0.4 ML SC SYRG
40 mg | Freq: Every day | SUBCUTANEOUS | 0 refills | Status: CN
Start: 2020-10-10 — End: ?

## 2020-10-10 MED ORDER — LORAZEPAM 2 MG/ML IJ SOLN
1 mg | INTRAVENOUS | 0 refills | Status: AC | PRN
Start: 2020-10-10 — End: ?
  Administered 2020-10-10 – 2020-10-11 (×2): 1 mg via INTRAVENOUS

## 2020-10-10 MED ORDER — PANTOPRAZOLE 40 MG IV SOLR
40 mg | Freq: Every day | INTRAVENOUS | 0 refills | Status: DC
Start: 2020-10-10 — End: 2020-10-10
  Administered 2020-10-10: 12:00:00 40 mg via INTRAVENOUS

## 2020-10-10 MED ORDER — FOLIC ACID 1 MG PO TAB
1 mg | Freq: Every day | ORAL | 0 refills | Status: AC
Start: 2020-10-10 — End: ?
  Administered 2020-10-10: 15:00:00 1 mg via ORAL

## 2020-10-10 MED ORDER — LORAZEPAM 2 MG/ML IJ SOLN
2 mg | INTRAVENOUS | 0 refills | Status: AC | PRN
Start: 2020-10-10 — End: ?
  Administered 2020-10-10 – 2020-10-11 (×4): 2 mg via INTRAVENOUS

## 2020-10-10 MED ORDER — DOXYCYCLINE HYCLATE 100 MG PO TAB
100 mg | Freq: Once | ORAL | 0 refills | Status: CP
Start: 2020-10-10 — End: ?
  Administered 2020-10-10: 09:00:00 100 mg via ORAL

## 2020-10-10 MED ORDER — THIAMINE 100MG IVPB
500 mg | INTRAVENOUS | 0 refills | Status: AC
Start: 2020-10-10 — End: ?
  Administered 2020-10-10 – 2020-10-11 (×8): 500 mg via INTRAVENOUS

## 2020-10-10 MED ORDER — ACETAMINOPHEN 500 MG PO TAB
500 mg | ORAL | 0 refills | Status: AC | PRN
Start: 2020-10-10 — End: ?
  Administered 2020-10-10 – 2020-10-11 (×3): 500 mg via ORAL

## 2020-10-10 MED ORDER — PHENOBARBITAL SODIUM 130 MG/ML IJ SOLN
260 mg | Freq: Once | INTRAVENOUS | 0 refills | Status: CP
Start: 2020-10-10 — End: ?
  Administered 2020-10-10: 09:00:00 260 mg via INTRAVENOUS

## 2020-10-10 MED ORDER — MELATONIN 5 MG PO TAB
5 mg | Freq: Every evening | ORAL | 0 refills | Status: AC | PRN
Start: 2020-10-10 — End: ?
  Administered 2020-10-11: 06:00:00 5 mg via ORAL

## 2020-10-10 MED ORDER — DOXYCYCLINE HYCLATE 100 MG PO TAB
100 mg | Freq: Two times a day (BID) | ORAL | 0 refills | Status: AC
Start: 2020-10-10 — End: ?
  Administered 2020-10-10 – 2020-10-11 (×3): 100 mg via ORAL

## 2020-10-10 MED ORDER — SODIUM CHLORIDE 0.9 % IV SOLP
INTRAVENOUS | 0 refills | Status: AC
Start: 2020-10-10 — End: ?
  Administered 2020-10-10 – 2020-10-11 (×3): 1000.000 mL via INTRAVENOUS

## 2020-10-10 MED ORDER — LORAZEPAM 1 MG PO TAB
1 mg | ORAL | 0 refills | Status: AC | PRN
Start: 2020-10-10 — End: ?

## 2020-10-10 MED ORDER — LORAZEPAM 2 MG/ML IJ SOLN
1 mg | INTRAMUSCULAR | 0 refills | Status: AC | PRN
Start: 2020-10-10 — End: ?

## 2020-10-10 MED ORDER — LORAZEPAM 2 MG/ML IJ SOLN
2 mg | INTRAMUSCULAR | 0 refills | Status: AC | PRN
Start: 2020-10-10 — End: ?

## 2020-10-10 MED ORDER — DIPHTH,PERTUS(ACELL),TETANUS 2.5-8-5 LF-MCG-LF/0.5ML IM SUSP
.5 mL | Freq: Once | INTRAMUSCULAR | 0 refills | Status: DC
Start: 2020-10-10 — End: 2020-10-10

## 2020-10-10 MED ORDER — PANTOPRAZOLE 40 MG IV SOLR
40 mg | Freq: Two times a day (BID) | INTRAVENOUS | 0 refills | Status: AC
Start: 2020-10-10 — End: ?
  Administered 2020-10-11: 03:00:00 40 mg via INTRAVENOUS

## 2020-10-10 NOTE — ED Notes
Per patient, we may speak with staff at [facility] and his counselors regarding his care if needed.  The number to this facility is [phone].  His counselor's name is [name] and her extension is [phone].

## 2020-10-10 NOTE — ED Notes
Message left for Dani at [facility] to call back regarding patient status.

## 2020-10-10 NOTE — ED Notes
Report to Raquel Sarna, RN on [phone].

## 2020-10-10 NOTE — ED Notes
Attempted to call report to floor.  RN unavailable.  Number has been provided in chart for call back.

## 2020-10-10 NOTE — Care Coordination-Inpatient
Patient moved from med private night 5 to med private O - 1st rounds for the morning. Please Voalte med private night 5 until 0800, then Voalte med private O - 1st rounds after 0800.

## 2020-10-10 NOTE — ED Provider Notes
Daniel French is a 49 y.o. male.    Chief Complaint:  Chief Complaint   Patient presents with   ? Seizure       History of Present Illness:  Daniel French is a 49 y.o. male with medical history of alcoholism, pancreatitis, PTSD, kidney stones, alcohol withdrawal seizures presenting today with concerns of seizure, desire for alcohol detox.  He reports he had previously been sober, but has been drinking for the last 3 weeks.  He stopped drinking a few days ago, on his last ED visit.  He subsequently had an alcohol withdrawal seizure within 36 hours of alcohol cessation.  He subsequently started drinking again to prevent seizures.  His last drink was prior to arrival, he reportedly has been drinking 1 gallon of vodka daily.  He reports after his seizure, he had scraped his knee on concrete.  He is unsure of his last tetanus.  He denies any other symptoms, no alleviating, aggravating factors reported.          Review of Systems:  Review of Systems   Constitutional: Negative for fever.   HENT: Negative for sore throat.    Eyes: Negative for visual disturbance.   Respiratory: Negative for shortness of breath.    Cardiovascular: Negative for chest pain.   Gastrointestinal: Negative for abdominal pain.   Genitourinary: Negative for dysuria.   Musculoskeletal: Negative for back pain.   Skin: Negative for rash.   Neurological: Positive for seizures. Negative for headaches.       Allergies:  Patient has no known allergies.    Past Medical History:  Medical History:   Diagnosis Date   ? Alcoholism (HCC)    ? Depression    ? Herniation of lumbar intervertebral disc    ? Nephrolithiasis    ? Pancreatitis    ? PTSD (post-traumatic stress disorder)    ? [facility]        Past Surgical History:  Surgical History:   Procedure Laterality Date   ? HX APPENDECTOMY     ? KIDNEY STONE SURGERY         Pertinent medical/surgical history reviewed    Social History:  Social History     Tobacco Use   ? Smoking status: Never Smoker   ? Smokeless tobacco: Current User     Types: Chew   Vaping Use   ? Vaping Use: Never used   Substance Use Topics   ? Alcohol use: Yes     Alcohol/week: 0.0 standard drinks   ? Drug use: Yes     Types: Methamphetamines, Cocaine, Heroin     Comment: pills, a baggie of some brown stuff.     Social History     Substance and Sexual Activity   Drug Use Yes   ? Types: Methamphetamines, Cocaine, Heroin    Comment: pills, a baggie of some brown stuff.             Family History:  Family History   Problem Relation Age of Onset   ? Heart Attack Other    ? Hypertension Other    ? None Reported Mother    ? Hypertension Father    ? Cancer Father    ? None Reported Sister    ? None Reported Brother    ? None Reported Sister    ? None Reported Brother        Vitals:  ED Vitals    Date and Time T BP P RR  SPO2P SPO2 User   10/10/20 0557 36.9 ?C (98.4 ?F) 143/95 84 16 PER MINUTE -- -- SB   10/10/20 0430 -- 125/78 84 -- 84 96 % JB   10/10/20 0300 36.8 ?C (98.2 ?F) 128/75 100 22 PER MINUTE -- -- JB   10/10/20 0232 -- -- 85 24 PER MINUTE 85 97 % JB   10/10/20 0231 -- -- 87 22 PER MINUTE 88 97 % JB   10/10/20 0230 -- 127/74 86 -- 86 97 % JB   10/10/20 0202 -- -- 84 22 PER MINUTE 84 99 % JB   10/10/20 0201 -- -- 84 22 PER MINUTE 84 99 % JB   10/10/20 0132 -- -- 89 20 PER MINUTE 88 98 % JB   10/10/20 0131 36.6 ?C (97.8 ?F) -- 91 16 PER MINUTE 90 98 % JB   10/10/20 0102 -- -- 95 18 PER MINUTE 95 98 % JB   10/10/20 0032 -- -- -- -- 99 96 % JB   10/10/20 0031 -- 145/84 -- -- -- -- JB   10/10/20 0010 36.9 ?C (98.4 ?F) 124/80 -- 20 PER MINUTE 109 100 % GE          Physical Exam:  Physical Exam  Vitals and nursing note reviewed.   Constitutional:       Appearance: Normal appearance. He is normal weight.   HENT:      Head: Normocephalic and atraumatic.   Eyes:      Extraocular Movements: Extraocular movements intact.      Conjunctiva/sclera: Conjunctivae normal.   Cardiovascular:      Rate and Rhythm: Normal rate and regular rhythm. Pulmonary:      Effort: Pulmonary effort is normal.      Breath sounds: Normal breath sounds.   Abdominal:      General: Bowel sounds are normal. There is no distension.      Palpations: Abdomen is soft.      Tenderness: There is no abdominal tenderness.   Musculoskeletal:         General: Normal range of motion.      Cervical back: Neck supple.   Skin:     General: Skin is warm and dry.      Comments: Multiple abrasions to bilateral knees, there is some surrounding erythema without purulent drainage   Neurological:      Mental Status: He is oriented to person, place, and time. Mental status is at baseline.   Psychiatric:         Mood and Affect: Mood normal.         Behavior: Behavior normal.         Laboratory Results:  Labs Reviewed   CBC AND DIFF - Abnormal       Result Value Ref Range Status    White Blood Cells 10.8  4.5 - 11.0 K/UL Final    RBC 3.65 (*) 4.4 - 5.5 M/UL Final    Hemoglobin 11.3 (*) 13.5 - 16.5 GM/DL Final    Hematocrit 16.1 (*) 40 - 50 % Final    MCV 89.9  80 - 100 FL Final    MCH 31.0  26 - 34 PG Final    MCHC 34.5  32.0 - 36.0 G/DL Final    RDW 09.6  11 - 15 % Final    Platelet Count 228  150 - 400 K/UL Final    MPV 6.8 (*) 7 - 11 FL Final    Neutrophils 83 (*)  41 - 77 % Final    Lymphocytes 9 (*) 24 - 44 % Final    Monocytes 8  4 - 12 % Final    Eosinophils 0  0 - 5 % Final    Basophils 0  0 - 2 % Final    Absolute Neutrophil Count 8.92 (*) 1.8 - 7.0 K/UL Final    Absolute Lymph Count 1.02  1.0 - 4.8 K/UL Final    Absolute Monocyte Count 0.83 (*) 0 - 0.80 K/UL Final    Absolute Eosinophil Count 0.02  0 - 0.45 K/UL Final    Absolute Basophil Count 0.02  0 - 0.20 K/UL Final    MDW (Monocyte Distribution Width) 25.7 (*) <20.7 Final   COMPREHENSIVE METABOLIC PANEL - Abnormal    Sodium 143  137 - 147 MMOL/L Final    Potassium 3.6  3.5 - 5.1 MMOL/L Final    Chloride 101  98 - 110 MMOL/L Final    Glucose 129 (*) 70 - 100 MG/DL Final    Blood Urea Nitrogen 11  7 - 25 MG/DL Final    Creatinine 6.96 0.4 - 1.24 MG/DL Final    Calcium 7.9 (*) 8.5 - 10.6 MG/DL Final    Total Protein 5.5 (*) 6.0 - 8.0 G/DL Final    Total Bilirubin 0.5  0.3 - 1.2 MG/DL Final    Albumin 3.3 (*) 3.5 - 5.0 G/DL Final    Alk Phosphatase 73  25 - 110 U/L Final    AST (SGOT) 52 (*) 7 - 40 U/L Final    CO2 27  21 - 30 MMOL/L Final    ALT (SGPT) 50  7 - 56 U/L Final    Anion Gap 15 (*) 3 - 12 Final    eGFR Non African American >60  >60 mL/min Final    eGFR African American >60  >60 mL/min Final   POC GLUCOSE - Abnormal    Glucose, POC 122 (*) 70 - 100 MG/DL Final   EXBMW-41 (SARS-COV-2) PCR    COVID-19 (SARS-CoV-2) PCR Source     Corrected    Value: FLOCKED SWAB  NASOPHARYNGEAL      COVID-19 (SARS-CoV-2) PCR NOT DETECTED  DN-NOT DETECTED Final   CBC   POC GLUCOSE   TYPE & CROSSMATCH     POC Glucose (Download): (!) 122    Radiology Interpretation:    No orders to display         EKG:    12 lead EKG shows regular rate and sinus rhythm.  PR interval, QRS duration, QT interval within normal limits.   STEMI Criteria not met       ED Course:  49 year old male presents for alcohol detox.  His vital signs at this time are reassuring, physical exam listed above.  Given his reported history of seizure while attempting home detox after recent ED visit, he would likely benefit from inpatient admission for safe for detox.  Basic labs obtained and reviewed, overall this is at his baseline.  We administered phenobarbital to help with withdrawal symptoms.  Doxycycline given due to the wounds on his lower extremities.  Tetanus updated at this time as well.  The internal medicine team accepted the patient to their service for detox.    Coding    Facility Administered Meds:  Medications   LORazepam (ATIVAN) tablet 1 mg (has no administration in time range)     Or   LORazepam (ATIVAN) injection 1 mg (has no administration  in time range)     Or   LORazepam (ATIVAN) injection 1 mg (has no administration in time range)   LORazepam (ATIVAN) tablet 2 mg (has no administration in time range)     Or   LORazepam (ATIVAN) injection 2 mg (has no administration in time range)     Or   LORazepam (ATIVAN) injection 2 mg (has no administration in time range)   thiamine (VITAMIN B-1) 500 mg in sodium chloride 0.9% (NS) 50 mL IVPB (has no administration in time range)     Followed by   thiamine (VITAMIN B-1) 200 mg in sodium chloride 0.9% (NS) 50 mL IVPB (has no administration in time range)   folic acid (FOLVITE) tablet 1 mg (has no administration in time range)   pantoprazole (PROTONIX) injection 40 mg (40 mg Intravenous Given 10/10/20 0532)   sodium chloride 0.9 %   infusion (has no administration in time range)   PHENobarbital injection 260 mg (260 mg Intravenous Given 10/10/20 0301)   doxycycline hyclate (VIBRACIN) tablet 100 mg (100 mg Oral Given 10/10/20 0300)   sodium chloride 0.9 %   infusion (500 mL Intravenous Given - New Bag 10/10/20 0532)         Clinical Impression:  Clinical Impression   Alcohol withdrawal, uncomplicated (HCC)       Disposition/Follow up  ED Disposition     ED Disposition    Admit        No follow-up provider specified.    Medications:  New Prescriptions    No medications on file       Procedure Notes:  Procedures        Attestation / Supervision:      Cleotilde Neer, DO

## 2020-10-10 NOTE — ED Notes
Pt to floor via wheelchair by floor staff members with all belongings.

## 2020-10-10 NOTE — ED Notes
Room is clean.  Transport has been placed.

## 2020-10-10 NOTE — Consults
Gastroenterology Consult Note  Patient Name:Daniel French         ZOX:[id number]  Admission Date: 10/10/2020 12:26 AM      Principal Problem:    Alcohol withdrawal, uncomplicated (HCC)      History of Present Illness/Subjective:    Daniel French is a 49 y.o. male with past medical history of severe alcohol dependence with alcohol withdrawal seizures, hypertension, and hyperlipidemia who presented to [facility] on 10/10/2020 with alcohol withdrawal symptoms and reported melena.    The patient was initially seen in the ER on the seventh of this month for alcohol intoxication but was discharged.  He then had a seizure and reports that he fell down on concrete incurring multiple bruises over most of his body. He then restarted drinking to prevent this from happening again and came to the hospital today to detox safely.  Over the past 5 weeks he has been drinking a gallon of vodka per day.    Yesterday the patient had an episode of dark tarry stools. There was no blood on the toilet paper or in the surrounding water. This was the first time this is ever happened to him.  He denies abdominal pain, constipation, and diarrhea.  He usually has 1-2 bowel movements of regularly formed brown stool per day. He endorses a 20 pound weight loss over the past year this has been unintentional although he attributes it to his drinking habit.  He has no personal or family history of colon cancer or any other cancers.  Patient denies any abdominal pain after eating.  Also denies any gastric reflux type symptoms.  He has not ingested any caustic substances such as Listerine, hand sanitizer, or Tide pods.    Pt has received 260 mg of phenobarbital in the ED and 2 of ativan so far today.    Assessment/ Plan:    #Melena, concern for GI bleed  #Alcohol withdrawal  #Normocytic anemia  #Hepatomegaly w/ diffuse hepatic steatosis      Recommendations:  >2 Large Bore IVs  >Type & cross, transfusion goal >7.1  >Protonix 40mg  BID  >EGD/Colonoscopy when pt asymptomatic from alcohol withdrawal. Will plan for Monday. Will f/u Hb and do more emergent procedures if pt actively bleeding & hemodynamically unstable      Patient seen/discussed with Dr. Tildon Husky, MD  Internal Medicine, PGY1  Available on Voalte   Pager: [phone]  10/10/2020 9:36 AM   -----------------------------  PMH:  Medical History:   Diagnosis Date   ? Alcoholism (HCC)    ? Depression    ? Herniation of lumbar intervertebral disc    ? Nephrolithiasis    ? Pancreatitis    ? PTSD (post-traumatic stress disorder)    ? Seizure [facility])        Current medications:  No current facility-administered medications on file prior to encounter.     Current Outpatient Medications on File Prior to Encounter   Medication Sig Dispense Refill   ? amphetamine-dextroamphetamine XR (ADDERALL XR) 20 mg capsule Take one capsule by mouth every morning 30 capsule 0   ? atorvastatin (LIPITOR) 10 mg tablet Take one tablet by mouth daily. 90 tablet 1   ? dextroamphetamine-amphetamine (ADDERALL) 10 mg tablet Take one tablet by mouth every evening as needed. 15 tablet 0   ? dextroamphetamine-amphetamine (ADDERALL) 30 mg tablet Take one tablet by mouth twice daily 60 tablet 0   ? fluoxetine (SARAFEM) 20 mg tablet Take one tablet by mouth  daily. 30 tablet 2   ? gabapentin (NEURONTIN) 300 mg capsule Take one capsule by mouth every 8 hours. 9 capsule 0   ? lisinopriL (ZESTRIL) 10 mg tablet Take one tablet by mouth daily. 30 tablet 0   ? lisinopriL (ZESTRIL) 20 mg tablet Take one tablet by mouth daily. 90 tablet 1   ? traZODone (DESYREL) 100 mg tablet TAKE 1 TABLET BY MOUTH AT BEDTIME AS NEEDED 90 tablet 1       PSH:  Surgical History:   Procedure Laterality Date   ? HX APPENDECTOMY     ? KIDNEY STONE SURGERY         SH:  Social History     Socioeconomic History   ? Marital status: Divorced     Spouse name: Not on file   ? Number of children: Not on file   ? Years of education: Not on file   ? Highest education level: Not on file   Occupational History   ? Not on file   Tobacco Use   ? Smoking status: Never Smoker   ? Smokeless tobacco: Current User     Types: Chew   Vaping Use   ? Vaping Use: Never used   Substance and Sexual Activity   ? Alcohol use: Yes     Alcohol/week: 0.0 standard drinks   ? Drug use: Yes     Types: Methamphetamines, Cocaine, Heroin     Comment: pills, a baggie of some brown stuff.   ? Sexual activity: Not on file   Other Topics Concern   ? Not on file   Social History Narrative    ** Merged History Encounter **            FH:  Family History   Problem Relation Age of Onset   ? Heart Attack Other    ? Hypertension Other    ? None Reported Mother    ? Hypertension Father    ? Cancer Father    ? None Reported Sister    ? None Reported Brother    ? None Reported Sister    ? None Reported Brother        Review of Systems:  Negative except as above    Physical Exam:  Vitals:    10/10/20 0430 10/10/20 0557 10/10/20 0700 10/10/20 0800   BP: 125/78 (!) 143/95 130/79 137/77   BP Source:    Arm, Left Upper   Pulse: 84 84 104 79   Temp:  36.9 ?C (98.4 ?F) 37 ?C (98.6 ?F) 36.6 ?C (97.9 ?F)   SpO2: 96%   95%   Weight:       Height:         General:  Awake, alert and ox3 , NAD  HEENT: no icterus.  Lung: CTAB   Heart:RRR, S1S2 heard, no murmur   Abdomen: Soft,Non tender to palpation, no mass, BS x4. Rectal exam with no nodularity, light greenish stool in vault  Lymphatics: No edema  Skin: no rash  Neuro: no focal deficits.     Laboratory:  Lab Results   Component Value Date    WBC 10.8 10/10/2020    HGB 11.3 (L) 10/10/2020    HCT 32.8 (L) 10/10/2020    MCV 89.9 10/10/2020     No components found for: SEDRATE  No results found for: CRP  No components found for: RETICCTPCT  Lab Results   Component Value Date    IRON 40 (L) 05/07/2020  TIBC 353 05/07/2020    FERRITIN 123 05/07/2020   No components found for: VITAMINB12No components found for: FOLATE  No results found for: HPYLORIAG  Lab Results   Component Value Date    NA 143 10/10/2020    K 3.6 10/10/2020    CO2 27 10/10/2020    CL 101 10/10/2020    BUN 11 10/10/2020     No results found for: AMYLASE  Lab Results   Component Value Date    LIPASE 122 (H) 06/10/2020     Lab Results   Component Value Date    TOTBILI 0.5 10/10/2020    ALBUMIN 3.3 (L) 10/10/2020    AST 52 (H) 10/10/2020    ALT 50 10/10/2020     Lab Results   Component Value Date    INR 1.1 05/08/2020    INR 1.0 02/03/2018    INR 1.0 05/31/2016     Lab Results   Component Value Date    HEPAIGM NEG 01/19/2016    HEPCAB NEG 01/19/2016   No components found for: ANANo components found for: SMOOTHMUSCAB, MITOAB    Imaging:   CT CHEST W CONTRAST  Narrative: CT of the chest, abdomen, and pelvis    HISTORY: fall, unable to ambulate, alcohol intoxication    TECHNIQUE: Multiple contiguous axial images were obtained throughout the chest, abdomen, and pelvis following the administration of intravenous contrast material. Axial images were reformatted into coronal and sagittal planes.    IV contrast: Yes  Bowel contrast administered: None.    COMPARISON: CT abdomen pelvis 04/16/2020, CT chest 02/03/2018    FINDINGS:    CHEST FINDINGS:      Lower Neck: Unremarkable      Axilla, Mediastinum and Hila: No lymphadenopathy. No mediastinal hematoma.       Heart and Great Vessels: The heart is normal in size. No pericardial effusion. Minimal coronary artery calcification. The main pulmonary artery is mildly dilated measuring 3.1 cm in diameter which can be seen with pulmonary hypertension. Thoracic aorta is normal in caliber without evidence of traumatic.      Airway, Lungs and Pleura: Central airways are patent. Minimal dependent atelectasis. No focal consolidation, pleural effusion, or pneumothorax. No suspicious pulmonary nodules are identified.     Chest Wall and Osseous Structures: Mild thoracic spondylosis. No acute thoracic spine or displaced rib fracture is identified. Old bilateral anterior rib fracture deformities. Chronic deformity of the right anterior glenoid rim.     ABDOMEN AND PELVIS FINDINGS:      Liver and Biliary system: Mild hepatomegaly with diffuse hepatic steatosis. Focal geographic steatosis along the fissure for the ligamentum teres. No discrete hepatic mass or injury. Major portal veins are patent. Gallbladder is nondilated. No biliary ductal dilatation.       Spleen: The spleen is normal in size without discrete injury.      Adrenal Glands and Kidneys: Adrenal glands are unremarkable. Small right renal cyst. Tiny nonobstructive right nephrolithiasis. Tiny left renal hypodensities too small to characterize. No hydronephrosis or extravasation of excreted urinary contrast on delayed imaging.     Pancreas and Retroperitoneum: The pancreas is unremarkable. No retroperitoneal lymphadenopathy. No retroperitoneal hematoma.      Aorta and Major Vessels: Abdominal aorta is normal in caliber. Minimal calcific atherosclerotic plaque.       Bowel, Mesentery and Peritoneal space: Bowel loops are normal in caliber. Prior appendectomy. No ascites. No pneumoperitoneum. No mesenteric adenopathy.     Pelvis: Mildly distended unopacified urinary bladder contains  a tiny dependent calculus in the right bladder lumen measuring 0.3 cm (series 2 image 116). Prostate is normal in size. Prominent bilateral inguinal and external iliac lymph nodes, likely reactive. Small fat-containing bilateral inguinal hernias.     Abdominal wall and Osseous Structures: Mild lumbar spondylosis. Stable ill-defined sclerosis in the right ischium. No acute lumbar spine, pelvic, or proximal femoral fracture is identified. Mild body wall edema along the hips and proximal lower extremity.     Impression:    CHEST:   1.  No evidence of major acute traumatic injury in the thorax.  2.  Minimal coronary artery calcification.      ABDOMEN AND PELVIS:   1.  No evidence of major acute traumatic injury in the abdomen or pelvis.  2.  Tiny dependent urinary bladder calculus and tiny nonobstructive right nephrolithiasis. No hydronephrosis.  3.  Mild hepatomegaly with diffuse hepatic steatosis.  4.  Prominent bilateral inguinal and external iliac lymph nodes, likely reactive.  5.  Mild subcutaneous edema along the hips and visualized proximal lower extremities.     Finalized by Jarrell Coop, M.D. on 10/06/2020 11:56 PM. Dictated by Khali Coop, M.D. on 10/06/2020 11:43 PM.  CT ABD/PELV W CONTRAST  Narrative: CT of the chest, abdomen, and pelvis    HISTORY: fall, unable to ambulate, alcohol intoxication    TECHNIQUE: Multiple contiguous axial images were obtained throughout the chest, abdomen, and pelvis following the administration of intravenous contrast material. Axial images were reformatted into coronal and sagittal planes.    IV contrast: Yes  Bowel contrast administered: None.    COMPARISON: CT abdomen pelvis 04/16/2020, CT chest 02/03/2018    FINDINGS:    CHEST FINDINGS:      Lower Neck: Unremarkable      Axilla, Mediastinum and Hila: No lymphadenopathy. No mediastinal hematoma.       Heart and Great Vessels: The heart is normal in size. No pericardial effusion. Minimal coronary artery calcification. The main pulmonary artery is mildly dilated measuring 3.1 cm in diameter which can be seen with pulmonary hypertension. Thoracic aorta is normal in caliber without evidence of traumatic.      Airway, Lungs and Pleura: Central airways are patent. Minimal dependent atelectasis. No focal consolidation, pleural effusion, or pneumothorax. No suspicious pulmonary nodules are identified.     Chest Wall and Osseous Structures: Mild thoracic spondylosis. No acute thoracic spine or displaced rib fracture is identified. Old bilateral anterior rib fracture deformities. Chronic deformity of the right anterior glenoid rim.     ABDOMEN AND PELVIS FINDINGS:      Liver and Biliary system: Mild hepatomegaly with diffuse hepatic steatosis. Focal geographic steatosis along the fissure for the ligamentum teres. No discrete hepatic mass or injury. Major portal veins are patent. Gallbladder is nondilated. No biliary ductal dilatation.       Spleen: The spleen is normal in size without discrete injury.      Adrenal Glands and Kidneys: Adrenal glands are unremarkable. Small right renal cyst. Tiny nonobstructive right nephrolithiasis. Tiny left renal hypodensities too small to characterize. No hydronephrosis or extravasation of excreted urinary contrast on delayed imaging.     Pancreas and Retroperitoneum: The pancreas is unremarkable. No retroperitoneal lymphadenopathy. No retroperitoneal hematoma.      Aorta and Major Vessels: Abdominal aorta is normal in caliber. Minimal calcific atherosclerotic plaque.       Bowel, Mesentery and Peritoneal space: Bowel loops are normal in caliber. Prior appendectomy. No ascites. No pneumoperitoneum. No mesenteric adenopathy.  Pelvis: Mildly distended unopacified urinary bladder contains a tiny dependent calculus in the right bladder lumen measuring 0.3 cm (series 2 image 116). Prostate is normal in size. Prominent bilateral inguinal and external iliac lymph nodes, likely reactive. Small fat-containing bilateral inguinal hernias.     Abdominal wall and Osseous Structures: Mild lumbar spondylosis. Stable ill-defined sclerosis in the right ischium. No acute lumbar spine, pelvic, or proximal femoral fracture is identified. Mild body wall edema along the hips and proximal lower extremity.     Impression:    CHEST:   1.  No evidence of major acute traumatic injury in the thorax.  2.  Minimal coronary artery calcification.      ABDOMEN AND PELVIS:   1.  No evidence of major acute traumatic injury in the abdomen or pelvis.  2.  Tiny dependent urinary bladder calculus and tiny nonobstructive right nephrolithiasis. No hydronephrosis.  3.  Mild hepatomegaly with diffuse hepatic steatosis.  4.  Prominent bilateral inguinal and external iliac lymph nodes, likely reactive.  5.  Mild subcutaneous edema along the hips and visualized proximal lower extremities.     Finalized by Darryle Coop, M.D. on 10/06/2020 11:56 PM. Dictated by Cotey Coop, M.D. on 10/06/2020 11:43 PM.  CHEST SINGLE VIEW  Narrative: AP portable chest    Clinical indication:    . Rib pain    Prior studies:    Comparison with prior chest and left rib radiographs Apr 16, 2020    Findings:    The heart size normal. The lungs are clear. There is no lobar consolidation or pleural effusion. No pneumothorax. No displaced rib fracture identified. The osseous structures are without acute abnormality. Upper abdomen is grossly unremarkable.  Impression: No acute cardiopulmonary abnormality.     Finalized by Earlyne Iba, MD, PhD on 10/06/2020 10:28 PM. Dictated by Earlyne Iba, MD, PhD on 10/06/2020 10:27 PM.  CT HEAD WO CONTRAST  Narrative: CT head, maxillofacial, and C-spine    HISTORY: fall, alcohol intoxication    TECHNIQUE: Multiple contiguous axial images were obtained of the brain, facial bones, and cervical spine without intravenous contrast. Sagittal and coronal reformations were obtained of the cervical spine and facial bones.     COMPARISON: CT head and C-spine 06/18/2020, CT maxillary facial 02/03/2018    FINDINGS:     Head:     The ventricles and subarachnoid spaces are normal in size and configuration. The gray-white matter interfaces are maintained. No acute intracranial hemorrhage or extra axial fluid collection is identified. The basal cisterns are patent. No midline shift or mass effect. The calvarium is intact.    Maxillofacial:     Small left premalar soft tissue contusion. No acute facial, mandibular, or orbital wall fracture is identified. Old depressed left orbital floor fracture deformity. The globes and orbits are otherwise unremarkable without evidence of orbital hemorrhage. Stable slight inferior displacement of the left inferior rectus muscle without significant herniation. Extraocular muscles are otherwise intact.     The temporomandibular joints are maintained. Mastoid air cells and middle ear cavities are clear. Debris in the right external auditory canal, likely cerumen. Minimal polypoid mucosal thickening in left maxillary alveolar recess. Paranasal sinuses are otherwise clear. Stable partially calcified soft tissue thickening along the left perimandibular region, likely scarring.    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 Otherwise normal cervical alignment. The craniocervical junction is intact. The vertebral body heights are maintained.  No acute cervical spine fracture or traumatic subluxation is identified.     Posterior disc osteophyte complexes and ligamentum flavum thickening result in persistent multilevel central spinal stenosis, most pronounced and of likely moderate degree at C6-C7. Uncovertebral and facet hypertrophy result in multilevel neural foraminal stenosis, most pronounced and of severe degree on the left at C4-C5 and bilaterally at C6-C7.    Cervical soft tissues are grossly unremarkable. Visualized lung apices are clear.  Impression: Head:     No acute intracranial hemorrhage or calvarial fracture.    Maxillofacial:     1.  Small facial contusion without evidence of acute facial fracture or orbital hemorrhage.    2.  Old left orbital floor fracture deformity.    Cervical Spine:     1.  No evidence of acute cervical spine fracture or traumatic subluxation.    2.  Cervical spondylosis as described.    3.  Congenital C5-C6 ankylosis.     Finalized by Jerek Coop, M.D. on 10/06/2020 9:24 PM. Dictated by Karsten Coop, M.D. on 10/06/2020 9:14 PM.  CT SPINE CERVICAL WO CONTRAST  Narrative: CT head, maxillofacial, and C-spine    HISTORY: fall, alcohol intoxication    TECHNIQUE: Multiple contiguous axial images were obtained of the brain, facial bones, and cervical spine without intravenous contrast. Sagittal and coronal reformations were obtained of the cervical spine and facial bones.     COMPARISON: CT head and C-spine 06/18/2020, CT maxillary facial 02/03/2018    FINDINGS:     Head:     The ventricles and subarachnoid spaces are normal in size and configuration. The gray-white matter interfaces are maintained. No acute intracranial hemorrhage or extra axial fluid collection is identified. The basal cisterns are patent. No midline shift or mass effect. The calvarium is intact.    Maxillofacial:     Small left premalar soft tissue contusion. No acute facial, mandibular, or orbital wall fracture is identified. Old depressed left orbital floor fracture deformity. The globes and orbits are otherwise unremarkable without evidence of orbital hemorrhage. Stable slight inferior displacement of the left inferior rectus muscle without significant herniation. Extraocular muscles are otherwise intact.     The temporomandibular joints are maintained. Mastoid air cells and middle ear cavities are clear. Debris in the right external auditory canal, likely cerumen. Minimal polypoid mucosal thickening in left maxillary alveolar recess. Paranasal sinuses are otherwise clear. Stable partially calcified soft tissue thickening along the left perimandibular region, likely scarring.    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 Otherwise normal cervical alignment. The craniocervical junction is intact. The vertebral body heights are maintained. No acute cervical spine fracture or traumatic subluxation is identified.     Posterior disc osteophyte complexes and ligamentum flavum thickening result in persistent multilevel central spinal stenosis, most pronounced and of likely moderate degree at C6-C7. Uncovertebral and facet hypertrophy result in multilevel neural foraminal stenosis, most pronounced and of severe degree on the left at C4-C5 and bilaterally at C6-C7.    Cervical soft tissues are grossly unremarkable. Visualized lung apices are clear.  Impression: Head:     No acute intracranial hemorrhage or calvarial fracture.    Maxillofacial:     1.  Small facial contusion without evidence of acute facial fracture or orbital hemorrhage.    2.  Old left orbital floor fracture deformity.    Cervical Spine:     1.  No evidence  of acute cervical spine fracture or traumatic subluxation.    2.  Cervical spondylosis as described.    3.  Congenital C5-C6 ankylosis.     Finalized by Briar Coop, M.D. on 10/06/2020 9:24 PM. Dictated by Duwayne Coop, M.D. on 10/06/2020 9:14 PM.  CT MAXIFACIAL/SINUS WO CONTRAST  Narrative: CT head, maxillofacial, and C-spine    HISTORY: fall, alcohol intoxication    TECHNIQUE: Multiple contiguous axial images were obtained of the brain, facial bones, and cervical spine without intravenous contrast. Sagittal and coronal reformations were obtained of the cervical spine and facial bones.     COMPARISON: CT head and C-spine 06/18/2020, CT maxillary facial 02/03/2018    FINDINGS:     Head:     The ventricles and subarachnoid spaces are normal in size and configuration. The gray-white matter interfaces are maintained. No acute intracranial hemorrhage or extra axial fluid collection is identified. The basal cisterns are patent. No midline shift or mass effect. The calvarium is intact.    Maxillofacial:     Small left premalar soft tissue contusion. No acute facial, mandibular, or orbital wall fracture is identified. Old depressed left orbital floor fracture deformity. The globes and orbits are otherwise unremarkable without evidence of orbital hemorrhage. Stable slight inferior displacement of the left inferior rectus muscle without significant herniation. Extraocular muscles are otherwise intact.     The temporomandibular joints are maintained. Mastoid air cells and middle ear cavities are clear. Debris in the right external auditory canal, likely cerumen. Minimal polypoid mucosal thickening in left maxillary alveolar recess. Paranasal sinuses are otherwise clear. Stable partially calcified soft tissue thickening along the left perimandibular region, likely scarring.    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 Otherwise normal cervical alignment. The craniocervical junction is intact. The vertebral body heights are maintained. No acute cervical spine fracture or traumatic subluxation is identified.     Posterior disc osteophyte complexes and ligamentum flavum thickening result in persistent multilevel central spinal stenosis, most pronounced and of likely moderate degree at C6-C7. Uncovertebral and facet hypertrophy result in multilevel neural foraminal stenosis, most pronounced and of severe degree on the left at C4-C5 and bilaterally at C6-C7.    Cervical soft tissues are grossly unremarkable. Visualized lung apices are clear.  Impression: Head:     No acute intracranial hemorrhage or calvarial fracture.    Maxillofacial:     1.  Small facial contusion without evidence of acute facial fracture or orbital hemorrhage.    2.  Old left orbital floor fracture deformity.    Cervical Spine:     1.  No evidence of acute cervical spine fracture or traumatic subluxation.    2.  Cervical spondylosis as described.    3.  Congenital C5-C6 ankylosis.     Finalized by Monique Coop, M.D. on 10/06/2020 9:24 PM. Dictated by Gared Coop, M.D. on 10/06/2020 9:14 PM.  HAND MIN 3 VIEWS LEFT  Narrative: HAND MIN 3 VIEWS LEFT    Clinical indication:    fall, left hand pain, abrasions.    Prior studies:    Bilateral hand radiographs February 03, 2018    Findings:    Frontal, lateral, and 2 oblique views of the left hand were obtained. The distal radius and ulna are intact. Carpus maintained. Metacarpals are intact. The phalanges are intact. No radiopaque foreign bodies. No lytic or blastic lesions.  Impression: No acute osseous abnormality.  Finalized by Earlyne Iba, MD, PhD on 10/06/2020 7:10 PM. Dictated by Earlyne Iba, MD, PhD on 10/06/2020 7:09 PM.

## 2020-10-11 ENCOUNTER — Encounter: Admit: 2020-10-11 | Discharge: 2020-10-11 | Primary: Family

## 2020-10-14 ENCOUNTER — Encounter: Admit: 2020-10-14 | Discharge: 2020-10-14 | Payer: PRIVATE HEALTH INSURANCE | Primary: Family

## 2020-10-14 MED FILL — LISINOPRIL 20 MG PO TAB: 20 mg | ORAL | 90 days supply | Qty: 90 | Fill #2 | Status: CN

## 2020-10-15 ENCOUNTER — Encounter: Admit: 2020-10-15 | Discharge: 2020-10-15 | Primary: Family

## 2020-11-12 ENCOUNTER — Encounter: Admit: 2020-11-12 | Discharge: 2020-11-12 | Primary: Family

## 2020-11-18 ENCOUNTER — Encounter: Admit: 2020-11-18 | Discharge: 2020-11-18 | Primary: Family

## 2020-11-18 ENCOUNTER — Emergency Department: Admit: 2020-11-18 | Discharge: 2020-11-18

## 2020-11-18 ENCOUNTER — Emergency Department: Admit: 2020-11-18 | Discharge: 2020-11-19

## 2020-11-18 DIAGNOSIS — F10920 Alcohol use, unspecified with intoxication, uncomplicated: Secondary | ICD-10-CM

## 2020-11-18 NOTE — ED Notes
Patient ambulated to the restroom with a steady gait.

## 2020-11-19 NOTE — ED Notes
Discharge instructions provided, pt verbalized understanding, no further questions/concerns. No PIV present. Pt is aaox4, ABCs intact, NAD noted. Pt ambulated out of ED with steady gait. Cab ride arranged by Eustaquio Maize, SW.

## 2020-11-19 NOTE — Case Management (ED)
ED Social Worker contacted by [employer], to assist patient with d/c transportation.  It was reported that patient is currently staying at a [employer]; [place] [facility].  Cab voucher provided to patient as patient states limited resources and supports available to assist with d/c.  Cab voucher completed and provided to Triage RN for patients d/c home.  Patient instructed to wait in the ED lobby for cab to arrive.    Rowan Blase, LMSW, LCSW  Overnight ED Social Worker  [phone]

## 2020-11-24 ENCOUNTER — Encounter: Admit: 2020-11-24 | Discharge: 2020-11-24 | Primary: Family

## 2020-11-24 ENCOUNTER — Emergency Department: Admit: 2020-11-24 | Discharge: 2020-11-24

## 2020-11-24 ENCOUNTER — Inpatient Hospital Stay: Admit: 2020-11-24

## 2020-11-24 DIAGNOSIS — F1020 Alcohol dependence, uncomplicated: Secondary | ICD-10-CM

## 2020-11-24 DIAGNOSIS — N200 Calculus of kidney: Secondary | ICD-10-CM

## 2020-11-24 DIAGNOSIS — F4310 Post-traumatic stress disorder, unspecified: Secondary | ICD-10-CM

## 2020-11-24 DIAGNOSIS — F1010 Alcohol abuse, uncomplicated: Secondary | ICD-10-CM

## 2020-11-24 DIAGNOSIS — F32A Depression: Secondary | ICD-10-CM

## 2020-11-24 DIAGNOSIS — F10129 Alcohol abuse with intoxication, unspecified: Secondary | ICD-10-CM

## 2020-11-24 DIAGNOSIS — F10220 Alcohol dependence with intoxication, uncomplicated: Secondary | ICD-10-CM

## 2020-11-24 DIAGNOSIS — R569 Unspecified convulsions: Secondary | ICD-10-CM

## 2020-11-24 DIAGNOSIS — K859 Acute pancreatitis without necrosis or infection, unspecified: Secondary | ICD-10-CM

## 2020-11-24 DIAGNOSIS — M5126 Other intervertebral disc displacement, lumbar region: Secondary | ICD-10-CM

## 2020-11-24 LAB — COVID-19 (SARS-COV-2) PCR

## 2020-11-24 LAB — URINALYSIS DIPSTICK REFLEX TO CULTURE
Lab: NEGATIVE mg/dL — ABNORMAL LOW (ref 0.3–1.2)
Lab: NEGATIVE mg/dL — ABNORMAL LOW (ref 8.5–10.6)

## 2020-11-24 LAB — URINALYSIS MICROSCOPIC REFLEX TO CULTURE

## 2020-11-24 LAB — CREATINE KINASE-CPK: Lab: 688 U/L — ABNORMAL HIGH (ref 35–232)

## 2020-11-24 LAB — ALCOHOL LEVEL
Lab: 423 mg/dL
Lab: 280 mg/dL

## 2020-11-24 LAB — COMPREHENSIVE METABOLIC PANEL: Lab: 142 MMOL/L (ref 137–147)

## 2020-11-24 LAB — CBC AND DIFF: Lab: 7.4 K/UL (ref 4.5–11.0)

## 2020-11-24 MED ORDER — LORAZEPAM 2 MG/ML IJ SOLN
1 mg | Freq: Two times a day (BID) | INTRAVENOUS | 0 refills | Status: AC
Start: 2020-11-24 — End: ?

## 2020-11-24 MED ORDER — SODIUM CHLORIDE 0.9 % IV SOLP
1000 mL | INTRAVENOUS | 0 refills | Status: AC
Start: 2020-11-24 — End: ?
  Administered 2020-11-25 – 2020-11-27 (×7): 1000 mL via INTRAVENOUS

## 2020-11-24 MED ORDER — IBUPROFEN 600 MG PO TAB
600 mg | Freq: Once | ORAL | 0 refills | Status: CP
Start: 2020-11-24 — End: ?
  Administered 2020-11-25: 02:00:00 600 mg via ORAL

## 2020-11-24 MED ORDER — LORAZEPAM 2 MG/ML IJ SOLN
1 mg | INTRAVENOUS | 0 refills | Status: AC
Start: 2020-11-24 — End: ?

## 2020-11-24 MED ORDER — ATORVASTATIN 10 MG PO TAB
10 mg | Freq: Every day | ORAL | 0 refills | Status: AC
Start: 2020-11-24 — End: ?
  Administered 2020-11-25 – 2020-11-27 (×3): 10 mg via ORAL

## 2020-11-24 MED ORDER — LORAZEPAM 2 MG/ML IJ SOLN
1 mg | INTRAVENOUS | 0 refills | Status: AC
Start: 2020-11-24 — End: ?
  Administered 2020-11-27 (×3): 1 mg via INTRAVENOUS

## 2020-11-24 MED ORDER — SODIUM CHLORIDE 0.9 % IV SOLP
1000 mL | INTRAVENOUS | 0 refills | Status: CP
Start: 2020-11-24 — End: ?
  Administered 2020-11-25: 1000 mL via INTRAVENOUS

## 2020-11-24 MED ORDER — ACETAMINOPHEN 325 MG PO TAB
650 mg | Freq: Once | ORAL | 0 refills | Status: DC
Start: 2020-11-24 — End: 2020-11-25

## 2020-11-24 MED ORDER — GABAPENTIN 300 MG PO CAP
300 mg | ORAL | 0 refills | Status: AC
Start: 2020-11-24 — End: ?
  Administered 2020-11-25: 04:00:00 300 mg via ORAL

## 2020-11-24 MED ORDER — ONDANSETRON HCL (PF) 4 MG/2 ML IJ SOLN
4 mg | Freq: Once | INTRAVENOUS | 0 refills | Status: CP
Start: 2020-11-24 — End: ?
  Administered 2020-11-25: 02:00:00 4 mg via INTRAVENOUS

## 2020-11-24 MED ORDER — MELATONIN 3 MG PO TAB
3 mg | Freq: Every evening | ORAL | 0 refills | Status: AC | PRN
Start: 2020-11-24 — End: ?

## 2020-11-24 MED ORDER — THIAMINE/FOLIC ACID IVPB
Freq: Once | INTRAVENOUS | 0 refills | Status: CP
Start: 2020-11-24 — End: ?
  Administered 2020-11-25 (×3): 50.000 mL via INTRAVENOUS

## 2020-11-24 MED ORDER — ONDANSETRON 4 MG PO TBDI
4 mg | ORAL | 0 refills | Status: AC | PRN
Start: 2020-11-24 — End: ?

## 2020-11-24 MED ORDER — LISINOPRIL 20 MG PO TAB
20 mg | Freq: Every day | ORAL | 0 refills | Status: AC
Start: 2020-11-24 — End: ?
  Administered 2020-11-25: 15:00:00 20 mg via ORAL

## 2020-11-24 MED ORDER — ENOXAPARIN 40 MG/0.4 ML SC SYRG
40 mg | Freq: Every day | SUBCUTANEOUS | 0 refills | Status: AC
Start: 2020-11-24 — End: ?
  Administered 2020-11-25 – 2020-11-27 (×3): 40 mg via SUBCUTANEOUS

## 2020-11-24 MED ORDER — LORAZEPAM 2 MG/ML IJ SOLN
2 mg | INTRAVENOUS | 0 refills | Status: AC
Start: 2020-11-24 — End: ?
  Administered 2020-11-25 – 2020-11-27 (×11): 2 mg via INTRAVENOUS

## 2020-11-24 MED ORDER — ONDANSETRON HCL (PF) 4 MG/2 ML IJ SOLN
4 mg | INTRAVENOUS | 0 refills | Status: AC | PRN
Start: 2020-11-24 — End: ?

## 2020-11-24 MED ORDER — PHENOBARBITAL SODIUM 130 MG/ML IJ SOLN
130 mg | Freq: Once | INTRAVENOUS | 0 refills | Status: CP
Start: 2020-11-24 — End: ?
  Administered 2020-11-25: 02:00:00 130 mg via INTRAVENOUS

## 2020-11-24 NOTE — ED Notes
Pt states, "Sorry, I think I'm gonna fuck with you." This RN replied, "How about you don't, and then you don't have to say sorry?" Pt states, "Fair enough."

## 2020-11-24 NOTE — ED Notes
Time of Fall: 1405     Objective (location/activity at time):  Pt was being transferred to ED room 14 from wheelchair. Pt jumped out of wheelchair onto bed & rolled off the end of the bed. Pt hit head on tile floor when fall occurred. Pt currently does not endorse pain/injury, but intoxicated.     Subjective:  Patient said "Ouch, I am ok. I am ok." Pt trying to ambulate back to bed w/ unsteady gait. Staff assisted patient back to bed.     Provider and Family Notified: Vernell Barrier, [place], [place], [phone], 11/24/2020    Assessment: Sam, RN did complete a head-to-toe assessment with vitals, Sharol Given, RN notified provider & charge of fall. No physical injuries noted, but patient did hit head. Vitals: HR 84, O2 98%, BP 136/79.      Follow-up Action (Diagnostic/Additional Safety Precautions): Education provided. C-collar placed, bed alarm in place, fall risk socks, Charge RN notified, curtain open for viewing other interventions at this time.

## 2020-11-24 NOTE — ED Notes
Daniel French is a 49 y/o male arriving to ED with CC of alcohol intoxication. Pt reports hx of alcohol withdrawal + seizures. Pt reports his last drink being today which was a fifth of liquor. Pt reports he is scared of being alone and having a seizure. Pt reports that he would like help and would like to be admitted to withdrawal. Pt had two wounds stitched on his face PTA. Upon arrival to ED room, pt fell from the bed and onto the floor where he hit the back of his head on the tile, see previous note by Sharol Given, RN. C-Collar placed and CMS intact. Bed rails padded and fall risk precautions (bed alarm, yellow socks, yellow wristband, & call light within reach) initiated and pt v/u of them. Pt A&OX4; VSS; breathing is even and non labored on RA; NAD noted.     Personal belongings: 1 shirt, 1 sweat pant, 2 socks, 1 coat

## 2020-11-25 MED ADMIN — GABAPENTIN 300 MG PO CAP [18308]: 900 mg | ORAL | @ 20:00:00 | Stop: 2020-11-29 | NDC 00904666661

## 2020-11-25 MED ADMIN — CLONIDINE HCL 0.1 MG PO TAB [1755]: 0.1 mg | ORAL | @ 20:00:00 | Stop: 2020-11-26 | NDC 60687011311

## 2020-11-25 MED ADMIN — FOLIC ACID 1 MG PO TAB [3233]: 1 mg | ORAL | @ 20:00:00 | NDC 62584089711

## 2020-11-25 MED ADMIN — THIAMINE MONONITRATE (VIT B1) 100 MG PO TAB [303868]: 100 mg | ORAL | @ 20:00:00 | NDC 50268085111

## 2020-11-25 NOTE — Progress Notes
VAT consulted to place PIV. Spoke with Janace Aris, RN who stated that ED staff able to plcae PIV. Consult discontinued.

## 2020-11-25 NOTE — ED Notes
Pt's current AWAS is a 6. Pt is asking for something for the ETOH withdrawal. Messaged Dr. Darene Lamer.

## 2020-11-26 ENCOUNTER — Inpatient Hospital Stay: Admit: 2020-11-26 | Discharge: 2020-11-26 | Primary: Family

## 2020-11-26 MED ADMIN — FOLIC ACID 1 MG PO TAB [3233]: 1 mg | ORAL | @ 16:00:00 | NDC 62584089711

## 2020-11-26 MED ADMIN — CLONIDINE HCL 0.1 MG PO TAB [1755]: 0.1 mg | ORAL | @ 12:00:00 | Stop: 2020-11-26 | NDC 60687011311

## 2020-11-26 MED ADMIN — CLONIDINE HCL 0.1 MG PO TAB [1755]: 0.1 mg | ORAL | @ 03:00:00 | Stop: 2020-11-26 | NDC 60687011311

## 2020-11-26 MED ADMIN — GABAPENTIN 300 MG PO CAP [18308]: 900 mg | ORAL | @ 03:00:00 | Stop: 2020-11-29 | NDC 00904666661

## 2020-11-26 MED ADMIN — THIAMINE MONONITRATE (VIT B1) 100 MG PO TAB [303868]: 100 mg | ORAL | @ 16:00:00 | NDC 50268085111

## 2020-11-26 MED ADMIN — GABAPENTIN 300 MG PO CAP [18308]: 900 mg | ORAL | @ 21:00:00 | Stop: 2020-11-29 | NDC 00904666661

## 2020-11-26 MED ADMIN — MELATONIN 3 MG PO TAB [16830]: 6 mg | ORAL | @ 03:00:00 | NDC 50268052411

## 2020-11-26 MED ADMIN — GABAPENTIN 300 MG PO CAP [18308]: 900 mg | ORAL | @ 12:00:00 | Stop: 2020-11-29 | NDC 00904666661

## 2020-11-26 MED ADMIN — CLONIDINE HCL 0.1 MG PO TAB [1755]: 0.1 mg | ORAL | @ 21:00:00 | Stop: 2020-11-29 | NDC 60687011311

## 2020-11-27 MED ADMIN — FOLIC ACID 1 MG PO TAB [3233]: 1 mg | ORAL | @ 15:00:00 | Stop: 2020-11-27 | NDC 62584089711

## 2020-11-27 MED ADMIN — GABAPENTIN 300 MG PO CAP [18308]: 900 mg | ORAL | @ 12:00:00 | Stop: 2020-11-27 | NDC 00904666661

## 2020-11-27 MED ADMIN — CLONIDINE HCL 0.1 MG PO TAB [1755]: 0.1 mg | ORAL | @ 15:00:00 | Stop: 2020-11-27 | NDC 60687011311

## 2020-11-27 MED ADMIN — DIPHENHYDRAMINE HCL 50 MG PO CAP [2510]: 50 mg | ORAL | @ 05:00:00 | Stop: 2020-11-27 | NDC 00904205661

## 2020-11-27 MED ADMIN — TRAZODONE 50 MG PO TAB [8085]: 25 mg | ORAL | @ 03:00:00 | NDC 00904686861

## 2020-11-27 MED ADMIN — MELATONIN 3 MG PO TAB [16830]: 6 mg | ORAL | @ 03:00:00 | NDC 50268052411

## 2020-11-27 MED ADMIN — GABAPENTIN 300 MG PO CAP [18308]: 900 mg | ORAL | @ 03:00:00 | Stop: 2020-11-29 | NDC 00904666661

## 2020-11-27 MED ADMIN — CLONIDINE HCL 0.1 MG PO TAB [1755]: 0.1 mg | ORAL | @ 03:00:00 | Stop: 2020-11-29 | NDC 60687011311

## 2020-11-27 MED ADMIN — THIAMINE MONONITRATE (VIT B1) 100 MG PO TAB [303868]: 100 mg | ORAL | @ 15:00:00 | Stop: 2020-11-27 | NDC 50268085111

## 2020-11-28 ENCOUNTER — Emergency Department: Admit: 2020-11-28 | Discharge: 2020-11-28 | Disposition: A | Discharge status: Home / Self Care

## 2020-11-28 ENCOUNTER — Encounter: Admit: 2020-11-28 | Discharge: 2020-11-28 | Primary: Family

## 2020-11-28 DIAGNOSIS — F10920 Alcohol use, unspecified with intoxication, uncomplicated: Secondary | ICD-10-CM

## 2020-11-28 DIAGNOSIS — F10220 Alcohol dependence with intoxication, uncomplicated: Secondary | ICD-10-CM

## 2020-11-28 LAB — CBC AND DIFF
Lab: 0 % (ref 0–2)
Lab: 0 K/UL (ref 0–0.20)
Lab: 0 K/UL (ref 0–0.45)
Lab: 0.3 K/UL (ref 0–0.80)
Lab: 1 % (ref 0–5)
Lab: 13 % (ref 11–15)
Lab: 13 g/dL — ABNORMAL LOW (ref 13.5–16.5)
Lab: 2.3 K/UL (ref 1.0–4.8)
Lab: 2.8 K/UL (ref 1.8–7.0)
Lab: 244 K/UL (ref 150–400)
Lab: 31 pg (ref 26–34)
Lab: 34 g/dL (ref 32.0–36.0)
Lab: 39 % — ABNORMAL LOW (ref 40–50)
Lab: 4.3 M/UL — ABNORMAL LOW (ref 4.4–5.5)
Lab: 42 % (ref 24–44)
Lab: 5.6 K/UL (ref 4.5–11.0)
Lab: 51 % (ref 41–77)
Lab: 6 % (ref 4–12)
Lab: 7.4 FL (ref 7–11)
Lab: 91 FL (ref 80–100)

## 2020-11-28 LAB — POC GLUCOSE
Lab: 106 mg/dL — ABNORMAL HIGH (ref 70–100)
Lab: 155 mg/dL — ABNORMAL HIGH (ref 70–100)

## 2020-11-28 NOTE — ED Notes
Pt arrived to the ED A&Ox4 with a CC of ETOH use. Pt was seen at this facility this morning and walked out of department prior to treatment complete. Pt present with PIV in right FA. Pt reports he has drank 18 pints of liquor today and took adderall . Pt uncooperative in room and throwing food. Pt reports that everything hurts him. When RN attempted to have pt clarify, pt reports "my asshole hurts me" and proceeds to lift legs.     Pt proceed to be uncooperative and response to question ask by this RN with "i'll do that to your mother." Pt unwilling to answer question regarding SI/HI to this RN, instead displayed verbally agressive behavior towards this RN, stating "you are an asshole. Get away from me."

## 2020-11-28 NOTE — ED Notes
belonging at grey box:     Shirt x 4, pants, shoes, belt, duffle bag with clothes, jacket.     CO at bedside

## 2020-11-28 NOTE — Progress Notes
Continuum of Care     Multiple Visit Patient - Case Management Note      NAME: Daniel French MRN: [id number] DOB: [birth year]  AGE: 49 y.o.    Admits in past 12 mos: ED Visits: 8 Admissions: 5     Source of Information: Patient, EMR, [place] [facility]     Primary MVP Case Manager: Daniel French     Reporting Out: SW coordinated with ED team, [facility] detox facilities, friend of Pt and met with Pt at bedside.     Driver of Utilization: Behavioral; substance use, mental health; Social; basic needs.    Disposition : ED Discharge     Interventions   ? SW coordinated with ED team on Pt presentation x2 overnight since IP discharge yesterday, Multi Visit Patient Status and disposition   ? SW met with Pt at bedside. Pt exhibits difficulty engaging in meaningful conversation, and is unable to complete full assessment, but is accepting of resources for outpatient detox or  homelessness depending on availability.   ? SW contacted the following detox facilities in [place] and [place] due to Pt currently being homeless.   [facility] ? no beds   o [facility] ? no beds  o [facility] ? had beds, but Pt is uninsured; cannot accept   o [facility] ? no beds  o [facility] ? Pt is uninsured; out of pocket cost is $850 per day   o [facility] ? will not accept transfer   o [facility] ? [place] not a referring partner   o [facility] ? Knows Pt from 2018 admission, Pt can return but no beds   ? Returned to bedside to discuss shelter options for Pt due to no acute needs warranting admission and no detox availability. Pt reports that he would like to contact a friend in [place] who will purchase a flight and he needs transportation to the airport. Pt and SW contacted Daniel French ? [phone] ? Pt?s primary support who lives in [place]. Pt is unable to fly to due lost photo ID. Pt is also unsure of the location of his phone and vehicle . Daniel French expressed concern and fatigue with Pt?s pattern of binge drinking. He is worried about his safety as Pt tends to steal alcohol or engage in fights. Daniel French paid for Pt to stay in a hotel upon discharge from IP yesterday which Pt was reportedly ?kicked out of?.  Pt has utilized detox and inpatient treatment multiple times and was recently living in [facility] living house.  Finally, discussed plan for [facility] referral upon future IP admission as Pt has no phone to be contacted for follow up and application unable to be completed in ED.   ? SW offered transportation to [employer] upon discharge. Pt states that he is ready to leave due to ?feeling antsy?. He does not want to wait on transportation and would prefer to get on a bus. Pt does not intend to stay in a shelter.  Pt is accepting of clean clothing.   ? SW contacted Daniel French to provide update on discharge plan and ensure that consistent follow up will occur and supports offered when Pt returns to ED / IP admissions.     Next Steps  ? Ongoing referrals to outpatient detox as appropriate   ? Consider completion of RADAC assessment at bedside due to Pt not having a phone   ? Referral to  [facility] for  or [place] Medicaid application pending Pt more permanent housing location   ? Contact Daniel French ? sober living house to determine Pt eligibility to return upon completion of detox  ? Connect to mental health resources       Daniel French  Cell Phone: [phone] or Daniel French

## 2020-11-28 NOTE — ED Notes
Pt alert and oriented x4 with no new complaint. Pt provided discharge instruction. Pt verbalize understanding and denies further question or concerns at this time.      Pt refused to signed discharge paper work. Pt ambulated out of department with steady gait.

## 2020-11-28 NOTE — ED Provider Notes
Daniel French is a 49 y.o. male.    Chief Complaint:  Chief Complaint   Patient presents with   ? Alcohol intoxication     reports drinking 'a couple fifths' in the last 24 hr, endorses Hx withdrawal seizures, last drink 1 hr PTA. sutured lac noted to nose and L eyebrow, pt unable to state when fall occurred and when it was repaired       History of Present Illness:  Patient is a 49 year old male with past medical history of alcoholism, withdrawal seizure, and pancreatitis presenting for alcohol intoxication.  Patient reports he was kicked out of his hotel by police for alcohol intoxication.  He reports he plan to catch a flight to [place] to meet up with his friend and reports he was too intoxicated to make his flight. He reports he presented to the emergency room because it was cold outside and he could not get a bus to the airport. He reports he drank 2 fifths in the last 24 hours since his discharge from the hospital 1 day prior.  He reports he has no other medical complaints at this time.  He denies any suicidal ideation or homicidal ideation.          Review of Systems:  Review of Systems   Constitutional: Negative for fever.   HENT: Negative for sore throat.    Eyes: Negative for visual disturbance.   Respiratory: Negative for shortness of breath.    Cardiovascular: Negative for chest pain.   Gastrointestinal: Negative for abdominal pain.   Genitourinary: Negative for dysuria.   Musculoskeletal: Negative for back pain.   Skin: Negative for rash.   Neurological: Negative for headaches.       Allergies:  Patient has no known allergies.    Past Medical History:  Medical History:   Diagnosis Date   ? Alcoholism (HCC)    ? Depression    ? Herniation of lumbar intervertebral disc    ? Nephrolithiasis    ? Pancreatitis    ? PTSD (post-traumatic stress disorder)    ? Seizure [facility])        Past Surgical History:  Surgical History:   Procedure Laterality Date   ? HX APPENDECTOMY     ? KIDNEY STONE SURGERY Pertinent medical/surgical history reviewed    Social History:  Social History     Tobacco Use   ? Smoking status: Never Smoker   ? Smokeless tobacco: Current User     Types: Chew   Vaping Use   ? Vaping Use: Never used   Substance Use Topics   ? Alcohol use: Yes     Alcohol/week: 0.0 standard drinks   ? Drug use: Yes     Types: Methamphetamines, Cocaine, Heroin     Comment: pills, a baggie of some brown stuff.     Social History     Substance and Sexual Activity   Drug Use Yes   ? Types: Methamphetamines, Cocaine, Heroin    Comment: pills, a baggie of some brown stuff.             Family History:  Family History   Problem Relation Age of Onset   ? Heart Attack Other    ? Hypertension Other    ? None Reported Mother    ? Hypertension Father    ? Cancer Father    ? None Reported Sister    ? None Reported Brother    ? None Reported Sister    ?  None Reported Brother        Vitals:  ED Vitals    Date and Time T BP P RR SPO2P SPO2 User   11/28/20 0426 -- -- 95 12 PER MINUTE 89 97 % ST   11/28/20 0237 37.2 ?C (99 ?F) 138/97 123 18 PER MINUTE -- 95 % CC          Physical Exam:  Physical Exam  Vitals and nursing note reviewed.   Constitutional:       Appearance: Normal appearance. He is normal weight.   HENT:      Head: Normocephalic and atraumatic.   Eyes:      Extraocular Movements: Extraocular movements intact.      Conjunctiva/sclera: Conjunctivae normal.      Pupils: Pupils are equal, round, and reactive to light.   Cardiovascular:      Rate and Rhythm: Normal rate and regular rhythm.   Pulmonary:      Effort: Pulmonary effort is normal.      Breath sounds: Normal breath sounds.   Abdominal:      General: Bowel sounds are normal. There is no distension.      Palpations: Abdomen is soft.      Tenderness: There is no abdominal tenderness.   Musculoskeletal:         General: Normal range of motion.      Cervical back: Neck supple.   Skin:     General: Skin is warm and dry.   Neurological:      Mental Status: He is oriented to person, place, and time. Mental status is at baseline.      Cranial Nerves: No cranial nerve deficit (2 through 12 intact).      Sensory: No sensory deficit (Sensation intact to light touch in all extremities).      Motor: No weakness (5/5 strength in all extremities).      Coordination: Coordination normal (Normal finger-nose, normal heel-to-shin).      Gait: Gait normal.   Psychiatric:         Mood and Affect: Mood normal.         Behavior: Behavior normal.         Laboratory Results:  Labs Reviewed   POC GLUCOSE - Abnormal       Result Value Ref Range Status    Glucose, POC 106 (*) 70 - 100 MG/DL Final     POC Glucose (Download): (!) 106    Radiology Interpretation:    NA    EKG:    NA    ED Course:  49 y.o. male presented to the ED for alcohol intoxication in the context of alcoholism.  - Patient was evaluated by resident and attending physicians.  - Available records were reviewed.  - Upon arrival to the ED patient tachycardic, mildly hypertensive, remainder vitals within normal limits  -Exam was without acute finding.  Patient had normal neurologic exam and was seen to ambulate with steady gait.  -Patient's point-of-care glucose was within normal limits.  -Patient had no desire to stay to withdrawal from alcohol.  Patient had no other concerning abnormalities or other medical complaints.  -Patient demonstrated clinical sobriety, ambulated with steady gait, was fully alert and oriented.  -Patient requested to leave and patient was determined to be appropriate for discharge.    - Dispo: Discharge  - Patient reassessed with appropriate vital signs and symptom control for discharge.   - Provided patient with discharge information, strict return precautions, and  follow up instructions. Patient verbalized understanding. All questions were answered prior to discharge. The patient was discharged from the emergency department in stable condition, demonstrating a clear understanding of return precautions and agreeable to follow up planning.          ED Scoring:                             MDM  Reviewed: previous chart, nursing note and vitals  Reviewed previous: labs and CT scan  Interpretation: labs        Facility Administered Meds:  Medications - No data to display      Clinical Impression:  Clinical Impression   Acute alcoholic intoxication in alcoholism without complication [facility])       Disposition/Follow up  ED Disposition     ED Disposition    Discharge        No follow-up provider specified.    Medications:  There are no discharge medications for this patient.      Procedure Notes:  Procedures        Attestation / Supervision:        Claudie Fisherman, MD

## 2020-11-29 ENCOUNTER — Emergency Department: Admit: 2020-11-29 | Discharge: 2020-11-28

## 2020-11-29 ENCOUNTER — Encounter: Admit: 2020-11-29 | Discharge: 2020-11-29 | Primary: Family

## 2020-11-29 ENCOUNTER — Inpatient Hospital Stay: Admit: 2020-11-29

## 2020-11-29 DIAGNOSIS — F32A Depression: Secondary | ICD-10-CM

## 2020-11-29 DIAGNOSIS — K859 Acute pancreatitis without necrosis or infection, unspecified: Secondary | ICD-10-CM

## 2020-11-29 DIAGNOSIS — F4310 Post-traumatic stress disorder, unspecified: Secondary | ICD-10-CM

## 2020-11-29 DIAGNOSIS — M5126 Other intervertebral disc displacement, lumbar region: Secondary | ICD-10-CM

## 2020-11-29 DIAGNOSIS — N200 Calculus of kidney: Secondary | ICD-10-CM

## 2020-11-29 DIAGNOSIS — R569 Unspecified convulsions: Secondary | ICD-10-CM

## 2020-11-29 DIAGNOSIS — F1020 Alcohol dependence, uncomplicated: Secondary | ICD-10-CM

## 2020-11-29 DIAGNOSIS — F10129 Alcohol abuse with intoxication, unspecified: Secondary | ICD-10-CM

## 2020-11-29 LAB — COMPREHENSIVE METABOLIC PANEL
Lab: 0.3 mg/dL (ref 0.3–1.2)
Lab: 0.7 mg/dL (ref 0.4–1.24)
Lab: 107 MMOL/L — ABNORMAL LOW (ref 98–110)
Lab: 110 U/L (ref 25–110)
Lab: 123 mg/dL — ABNORMAL HIGH (ref 70–100)
Lab: 13 mg/dL (ref 7–25)
Lab: 14 K/UL — ABNORMAL HIGH (ref 3–12)
Lab: 144 MMOL/L — ABNORMAL LOW (ref 137–147)
Lab: 23 MMOL/L (ref 21–30)
Lab: 239 U/L — ABNORMAL HIGH (ref 7–40)
Lab: 3.6 MMOL/L — ABNORMAL LOW (ref 3.5–5.1)
Lab: 4.2 g/dL (ref 3.5–5.0)
Lab: 408 U/L — ABNORMAL HIGH (ref 7–56)
Lab: 7.1 g/dL (ref 6.0–8.0)
Lab: 8.1 mg/dL — ABNORMAL LOW (ref 8.5–10.6)
Lab: >60 mL/min (ref >60)

## 2020-11-29 LAB — PHENCYCLIDINES-URINE RANDOM: Lab: NEGATIVE

## 2020-11-29 LAB — CBC AND DIFF
Lab: 0 K/UL (ref 0–0.20)
Lab: 0 K/UL (ref 0–0.45)
Lab: 0.3 K/UL (ref 0–0.80)
Lab: 19 (ref <20.7)
Lab: 6 K/UL (ref 4.5–11.0)

## 2020-11-29 LAB — MAGNESIUM: Lab: 2 mg/dL (ref 1.6–2.6)

## 2020-11-29 LAB — COVID-19 (SARS-COV-2) PCR

## 2020-11-29 LAB — POC TROPONIN: Lab: 0 ng/mL (ref 0.00–0.05)

## 2020-11-29 LAB — BENZODIAZEPINES-URINE RANDOM: Lab: POSITIVE — AB

## 2020-11-29 LAB — PHOSPHORUS: Lab: 4.6 mg/dL — ABNORMAL HIGH (ref 2.0–4.5)

## 2020-11-29 LAB — ALCOHOL LEVEL: Lab: 433 mg/dL

## 2020-11-29 LAB — AMPHETAMINES-URINE RANDOM: Lab: NEGATIVE

## 2020-11-29 LAB — CREATINE KINASE-CPK: Lab: 268 U/L — ABNORMAL HIGH (ref 35–232)

## 2020-11-29 LAB — CANNABINOIDS-URINE RANDOM: Lab: NEGATIVE

## 2020-11-29 LAB — BARBITURATES-URINE RANDOM: Lab: POSITIVE — AB

## 2020-11-29 LAB — COCAINE-URINE RANDOM: Lab: NEGATIVE

## 2020-11-29 LAB — OPIATES-URINE RANDOM: Lab: NEGATIVE

## 2020-11-29 MED ORDER — ENOXAPARIN 40 MG/0.4 ML SC SYRG
40 mg | Freq: Every day | SUBCUTANEOUS | 0 refills | Status: AC
Start: 2020-11-29 — End: ?
  Administered 2020-11-30 – 2020-12-01 (×2): 40 mg via SUBCUTANEOUS

## 2020-11-29 MED ORDER — LACTATED RINGERS IV SOLP
1000 mL | INTRAVENOUS | 0 refills | Status: AC
Start: 2020-11-29 — End: ?
  Administered 2020-11-29 – 2020-11-30 (×4): 1000 mL via INTRAVENOUS

## 2020-11-29 MED ORDER — MAGNESIUM SULFATE IN D5W 1 GRAM/100 ML IV PGBK
1 g | INTRAVENOUS | 0 refills | Status: CP
Start: 2020-11-29 — End: ?
  Administered 2020-11-30: 1 g via INTRAVENOUS

## 2020-11-29 MED ORDER — MAGNESIUM SULFATE IN D5W 1 GRAM/100 ML IV PGBK
1 g | INTRAVENOUS | 0 refills | Status: AC
Start: 2020-11-29 — End: ?
  Administered 2020-11-30: 04:00:00 1 g via INTRAVENOUS

## 2020-11-29 MED ORDER — GABAPENTIN 300 MG PO CAP
600 mg | ORAL | 0 refills | Status: AC
Start: 2020-11-29 — End: ?

## 2020-11-29 MED ORDER — CLONIDINE HCL 0.1 MG PO TAB
.1 mg | ORAL | 0 refills | Status: CP
Start: 2020-11-29 — End: ?
  Administered 2020-11-29 – 2020-11-30 (×2): 0.1 mg via ORAL

## 2020-11-29 MED ORDER — LORAZEPAM 1 MG PO TAB
1 mg | ORAL | 0 refills | Status: AC | PRN
Start: 2020-11-29 — End: ?
  Administered 2020-11-30: 1 mg via ORAL

## 2020-11-29 MED ORDER — LORAZEPAM 2 MG/ML IJ SOLN
2 mg | INTRAMUSCULAR | 0 refills | Status: AC | PRN
Start: 2020-11-29 — End: ?

## 2020-11-29 MED ORDER — FOLIC ACID 1 MG PO TAB
1 mg | Freq: Every day | ORAL | 0 refills | Status: AC
Start: 2020-11-29 — End: ?
  Administered 2020-11-29 – 2020-12-01 (×3): 1 mg via ORAL

## 2020-11-29 MED ORDER — MAGNESIUM SULFATE IN D5W 1 GRAM/100 ML IV PGBK
1 g | INTRAVENOUS | 0 refills | Status: CP
Start: 2020-11-29 — End: ?
  Administered 2020-11-29: 17:00:00 1 g via INTRAVENOUS

## 2020-11-29 MED ORDER — ACETAMINOPHEN 500 MG PO TAB
1000 mg | ORAL | 0 refills | Status: AC | PRN
Start: 2020-11-29 — End: ?
  Administered 2020-11-30: 04:00:00 1000 mg via ORAL

## 2020-11-29 MED ORDER — CLONIDINE HCL 0.2 MG PO TAB
.2 mg | Freq: Two times a day (BID) | ORAL | 0 refills | Status: AC
Start: 2020-11-29 — End: ?
  Administered 2020-11-30 – 2020-12-01 (×3): 0.2 mg via ORAL

## 2020-11-29 MED ORDER — CLONIDINE HCL 0.1 MG PO TAB
.1 mg | Freq: Two times a day (BID) | ORAL | 0 refills | Status: AC
Start: 2020-11-29 — End: ?

## 2020-11-29 MED ORDER — LORAZEPAM 2 MG/ML IJ SOLN
2 mg | INTRAVENOUS | 0 refills | Status: AC | PRN
Start: 2020-11-29 — End: ?

## 2020-11-29 MED ORDER — THIAMINE 100MG IVPB
500 mg | INTRAVENOUS | 0 refills | Status: AC
Start: 2020-11-29 — End: ?
  Administered 2020-11-29 – 2020-12-01 (×12): 500 mg via INTRAVENOUS

## 2020-11-29 MED ORDER — LORAZEPAM 2 MG/ML IJ SOLN
1 mg | INTRAMUSCULAR | 0 refills | Status: AC | PRN
Start: 2020-11-29 — End: ?

## 2020-11-29 MED ORDER — GABAPENTIN 300 MG PO CAP
900 mg | ORAL | 0 refills | Status: AC
Start: 2020-11-29 — End: ?
  Administered 2020-11-29 – 2020-12-01 (×7): 900 mg via ORAL

## 2020-11-29 MED ORDER — MELATONIN 5 MG PO TAB
10 mg | Freq: Every evening | ORAL | 0 refills | Status: AC
Start: 2020-11-29 — End: ?
  Administered 2020-11-30 – 2020-12-01 (×2): 10 mg via ORAL

## 2020-11-29 MED ORDER — GABAPENTIN 300 MG PO CAP
300 mg | ORAL | 0 refills | Status: AC
Start: 2020-11-29 — End: ?

## 2020-11-29 MED ORDER — THIAMINE MONONITRATE (VIT B1) 100 MG PO TAB
100 mg | Freq: Every day | ORAL | 0 refills | Status: DC
Start: 2020-11-29 — End: 2020-11-29

## 2020-11-29 MED ORDER — LORAZEPAM 2 MG/ML IJ SOLN
1 mg | INTRAVENOUS | 0 refills | Status: AC | PRN
Start: 2020-11-29 — End: ?
  Administered 2020-11-29: 19:00:00 1 mg via INTRAVENOUS

## 2020-11-29 MED ORDER — MAGNESIUM SULFATE IN D5W 1 GRAM/100 ML IV PGBK
1 g | INTRAVENOUS | 0 refills | Status: CP
Start: 2020-11-29 — End: ?
  Administered 2020-11-29: 20:00:00 1 g via INTRAVENOUS

## 2020-11-29 MED ORDER — ACETAMINOPHEN/LIDOCAINE/ANTACID DS(#) 1:1:3  PO SUSP
30 mL | Freq: Once | ORAL | 0 refills | Status: DC
Start: 2020-11-29 — End: 2020-11-29

## 2020-11-29 MED ORDER — LORAZEPAM 1 MG PO TAB
2 mg | ORAL | 0 refills | Status: AC | PRN
Start: 2020-11-29 — End: ?
  Administered 2020-11-30: 04:00:00 2 mg via ORAL

## 2020-11-29 MED ORDER — FOLIC ACID 1 MG PO TAB
1 mg | Freq: Every day | ORAL | 0 refills | Status: DC
Start: 2020-11-29 — End: 2020-11-29

## 2020-11-29 MED ORDER — THIAMINE 100MG IVPB
200 mg | Freq: Every day | INTRAVENOUS | 0 refills | Status: AC
Start: 2020-11-29 — End: ?

## 2020-11-29 MED ORDER — LACTATED RINGERS IV SOLP
1000 mL | Freq: Once | INTRAVENOUS | 0 refills | Status: DC
Start: 2020-11-29 — End: 2020-11-29

## 2020-11-29 NOTE — Progress Notes
RT Adult Assessment Note    NAME:Daniel French             MRN: [id number]             DOB:06/15/1971          AGE: 49 y.o.  ADMISSION DATE: 11/29/2020             DAYS ADMITTED: LOS: 0 days    RT Treatment Plan:            Additional Comments:  Impressions of the patient: stable  Intervention(s)/outcome(s): n/a  Patient education that was completed: n/a  Recommendations to the care team: continue to monitor    Vital Signs:  Pulse:    RR: 18 PER MINUTE  SpO2: 97 %  O2 Device:    Liter Flow:    O2%: 21 %  Breath Sounds: Clear (Implies normal)  Respiratory Effort: Non-Labored

## 2020-11-29 NOTE — H&P (View-Only)
ADMISSION HISTORY AND PHYSICAL      Name:  Daniel French                                             MRN:  [id number]   Admission Date:  11/29/2020    ASSESSMENT & PLAN     Daniel French is a 49 year old male with a past medical history of severe alcohol abuse (prior to arrival, ongoing), polysubstance abuse (methamphetamine, marijuana (prior to arrival, ongoing]), homelessness, complex care coordination, history of melena, history of hypertension (not on antihypertensive medications at baseline; suspect alcohol and illicit drug use driven) and hyperlipidemia who presented to the emergency department with presented to emergency department for further management/evaluation of alcohol intoxication.  Primary motive for presentation to the emergency department and admission is likely driven by homelessness.      Current assessment:  ? Severe alcohol abuse (prior to arrival, ongoing) presenting with acute intoxication  ? History of alcohol-related withdrawal seizures  ? 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 Homelessness  ? Complex care coordination  ? History of recurrent presentations to numerous emergency departments throughout the area/with intermittent admissions, secondary to the above    ? Please see my note/discharge 11/27/2020 for further details    Labs regimen/medication reviewed and are noted.    Current plan:  ?AWAS, BZD sparing protocol (gabapentin taper, clonidine taper [if blood pressure allows])  ?Seizure percussion  ?High-dose thiamine, folate acid1 mg daily  ?LR at 100 mL/h  ?Admission labs ordered    Disposition/discharge:  ?Patient likely AMA like he normally does.  It is okay for patient to leave AMA.    FEN: LR, electrolytes reviewed, No diet orders on file   VTE Prophalyxis:  Lovenox  Disposition: Admit to inpatient Med Private [facility]- [facility]30  Code status: Full Cude (Discussed on 11/29/2020)        Ezzard Flax, MD       11/29/2020  Internal Medicine         Available on Voalte  Med Private O- [facility]30            History of Present Illness     Daniel French is a 49 year old male with a past medical history of severe alcohol abuse (prior to arrival, ongoing), polysubstance abuse (methamphetamine, marijuana (prior to arrival, ongoing]), homelessness, complex care coordination, history of melena, history of hypertension (not on antihypertensive medications at baseline; suspect alcohol and illicit drug use driven) and hyperlipidemia who presented to the emergency department with presented to emergency department for further management/evaluation of alcohol intoxication.  Primary motive for presentation to the emergency department and admission is likely driven by homelessness.    Patient severely intoxicated and is not wanting to participate in exam    Review of Systems   A comprehensive 14-point ROS  was negative except for:      Review of Systems   Reason unable to perform ROS: Patient severely intoxicated and not willing to participate in any discussion.       Past medical history    has a past medical history of Alcoholism (HCC), Depression, Herniation of lumbar intervertebral disc, Nephrolithiasis, Pancreatitis, PTSD (post-traumatic stress disorder), and Seizure (HCC).     Past surgical history    has a past surgical history that includes Kidney stone surgery and appendectomy.  Family history     Family History   Problem Relation Age of Onset   ? Heart Attack Other    ? Hypertension Other    ? None Reported Mother    ? Hypertension Father    ? Cancer Father    ? None Reported Sister    ? None Reported Brother    ? None Reported Sister    ? None Reported Brother      Social history     Social History     Socioeconomic History   ? Marital status: Divorced     Spouse name: Not on file   ? Number of children: Not on file   ? Years of education: Not on file   ? Highest education level: Not on file   Occupational History   ? Not on file   Tobacco Use   ? Smoking status: Never Smoker   ? Smokeless tobacco: Current User     Types: Chew   Vaping Use   ? Vaping Use: Never used   Substance and Sexual Activity   ? Alcohol use: Yes     Alcohol/week: 0.0 standard drinks   ? Drug use: Yes     Types: Methamphetamines, Cocaine, Heroin     Comment: pills, a baggie of some  stuff.   ? Sexual activity: Not on file   Other Topics Concern   ? Not on file   Social History Narrative    ** Merged History Encounter **           Allergies   Patient has no known allergies.    Medications     Current Facility-Administered Medications   Medication   ? cloNIDine HCL (CATAPRES) tablet 0.1 mg    Followed by   ? Melene Muller ON 11/30/2020] cloNIDine HCL (CATAPRES) tablet 0.2 mg    Followed by   ? Melene Muller ON 12/03/2020] cloNIDine HCL (CATAPRES) tablet 0.1 mg   ? folic acid (FOLVITE) tablet 1 mg   ? gabapentin (NEURONTIN) capsule 900 mg    Followed by   ? Melene Muller ON 12/02/2020] gabapentin (NEURONTIN) capsule 600 mg    Followed by   ? Melene Muller ON 12/04/2020] gabapentin (NEURONTIN) capsule 300 mg   ? lactated ringers infusion   ? LORazepam (ATIVAN) tablet 1 mg    Or   ? LORazepam (ATIVAN) injection 1 mg    Or   ? LORazepam (ATIVAN) injection 1 mg   ? LORazepam (ATIVAN) tablet 2 mg    Or   ? LORazepam (ATIVAN) injection 2 mg    Or   ? LORazepam (ATIVAN) injection 2 mg   ? magnesium sulfate   1 g/D5W 100 mL IVPB    Followed by   ? magnesium sulfate   1 g/D5W 100 mL IVPB    Followed by   ? magnesium sulfate   1 g/D5W 100 mL IVPB    Followed by   ? magnesium sulfate   1 g/D5W 100 mL IVPB   ? melatonin tablet 10 mg   ? thiamine (VITAMIN B-1) 500 mg in sodium chloride 0.9% (NS) 50 mL IVPB    Followed by   ? [START ON 12/02/2020] thiamine (VITAMIN B-1) 200 mg in sodium chloride 0.9% (NS) 50 mL IVPB     No current outpatient medications on file.          Physical exam     BP: 112/74 (12/31 0930)  Temp: 36.5 ?C (97.7 ?F) (12/31 1610)  Pulse: 86 (12/31 0930)  Respirations: 21 PER MINUTE (12/31 0930)  SpO2: 98 % (12/31 0930)  SpO2 Pulse: 85 (12/31 0930) BP: (108-149)/(69-103)   Temp:  [36.5 ?C (97.7 ?F)-37.1 ?C (98.8 ?F)]   Pulse:  [83-95]   Respirations:  [16 PER MINUTE-22 PER MINUTE]   SpO2:  [94 %-100 %]   Vitals:    11/29/20 0737   Weight: 90.7 kg (200 lb)          Patient significant intoxicated and not willing to participate in any exam    Labs        Recent Labs     11/27/20  0652 11/28/20  2223   NA 141 143   K 3.7 3.0*   CL 106 104   CO2 26 22   BUN 8 11   CR 0.57 1.07   GAP 9 17*   ALBUMIN 3.3* 4.4   TOTBILI 0.5 0.4   AST 273* 312*   ALT 335* 465*    Recent Labs     11/28/20  2223 11/28/20  2223 11/29/20  0921   WBC 5.6  --  6.0   HGB 13.4*  --  13.0*   HCT 39.6*  --  38.3*   PLTCT 244  --  217   MCV 91.6   < > 91.6    < > = values in this interval not displayed.          Radiology and Other Diagnostic Procedures Review     No results found.

## 2020-11-29 NOTE — ED Triage Notes
Patient ambulating around triage with a steady gait. The patient was witnessed leaving the department and returning through security. Patient states he would still like to be evaluated but unhappy about being asked to sit in the main waiting area.

## 2020-11-29 NOTE — ED Notes
Report given to [facility]  Transport request placed

## 2020-11-29 NOTE — ED Notes
28 yr/ol male presents to ED with CC of ETOH. Pt states he has not had any drugs or alcohol this morning. Pt reported to MD that he drank "18 pints". States his last drink was 6-10 years ago. Pt endorses HI. Denies SI. States that he wants to her "lots of people because why wouldn't he". Pt uncooperative with answering questions. Pt denies pain. Pt placed in yellow gown. CO at bedside

## 2020-11-29 NOTE — ED Notes
Pt witnessed walking into main hospital via cctv

## 2020-11-29 NOTE — ED Notes
Patient arrives to ED with CC unable to verbalize why he is back in the ER. When asked what it is that he needs patient states "I guess just to be loved" and then begins crying. Patient was discharged from ER earlier this am. Patient arrives with stable VS and in no acute distress. Awaiting provider evaluation.     Medical History:   Diagnosis Date    Alcoholism [facility])     Depression     Herniation of lumbar intervertebral disc     Nephrolithiasis     Pancreatitis     PTSD (post-traumatic stress disorder)     Seizure (HCC)          Belongings: clothing

## 2020-11-29 NOTE — ED Triage Notes
Patient found to be sleeping on the floor.

## 2020-11-30 ENCOUNTER — Inpatient Hospital Stay: Admit: 2020-11-30 | Discharge: 2020-11-30 | Primary: Family

## 2020-11-30 ENCOUNTER — Encounter: Admit: 2020-11-30 | Discharge: 2020-11-30 | Primary: Family

## 2020-11-30 MED ADMIN — HYDROXYZINE HCL 25 MG PO TAB [3774]: 25 mg | ORAL | @ 23:00:00 | NDC 00904661761

## 2020-11-30 MED ADMIN — NICOTINE 21 MG/24 HR TD PT24 [27863]: 1 | TRANSDERMAL | @ 23:00:00 | NDC 60505706300

## 2020-12-01 ENCOUNTER — Encounter: Admit: 2020-12-01 | Discharge: 2020-12-01 | Payer: PRIVATE HEALTH INSURANCE | Primary: Family

## 2020-12-01 ENCOUNTER — Inpatient Hospital Stay: Admit: 2020-12-01 | Discharge: 2020-12-01 | Payer: PRIVATE HEALTH INSURANCE | Primary: Family

## 2020-12-01 MED ADMIN — SODIUM CHLORIDE 0.9 % IV SOLP [27838]: 250 mL | INTRAVENOUS | @ 13:00:00 | Stop: 2020-12-01 | NDC 00338004902

## 2020-12-01 MED ADMIN — HYDROXYZINE HCL 25 MG PO TAB [3774]: 25 mg | ORAL | @ 05:00:00 | NDC 00904661761

## 2020-12-01 MED FILL — GABAPENTIN 300 MG PO CAP: 300 mg | 7 days supply | Qty: 33 | Fill #1 | Status: CN

## 2020-12-01 MED FILL — FOLIC ACID 1 MG PO TAB: 1 mg | ORAL | 90 days supply | Qty: 90 | Fill #1 | Status: AC

## 2020-12-01 MED FILL — CLONIDINE HCL 0.2 MG PO TAB: 0.2 mg | ORAL | 6 days supply | Qty: 9 | Fill #1 | Status: CN

## 2020-12-01 NOTE — Progress Notes
VAT consulted for PIV & lab. Patient has no viable vessels bilaterally at this time. Bilaterally non compressible.

## 2020-12-05 ENCOUNTER — Encounter: Admit: 2020-12-05 | Discharge: 2020-12-05 | Primary: Family

## 2020-12-05 ENCOUNTER — Emergency Department: Admit: 2020-12-05 | Discharge: 2020-12-05

## 2020-12-05 ENCOUNTER — Emergency Department: Admit: 2020-12-05 | Discharge: 2020-12-06

## 2020-12-05 DIAGNOSIS — F10220 Alcohol dependence with intoxication, uncomplicated: Secondary | ICD-10-CM

## 2020-12-05 LAB — CBC AND DIFF
Lab: 0 K/UL (ref 0–0.20)
Lab: 0 K/UL (ref 0–0.45)
Lab: 0.4 K/UL (ref 0–0.80)
Lab: 16 (ref <20.7)
Lab: 5.3 K/UL (ref 4.5–11.0)

## 2020-12-05 LAB — COMPREHENSIVE METABOLIC PANEL
Lab: 0.5 mg/dL — ABNORMAL LOW (ref 0.3–1.2)
Lab: 0.7 mg/dL (ref 0.4–1.24)
Lab: 102 mg/dL — ABNORMAL HIGH (ref 70–100)
Lab: 104 MMOL/L (ref 98–110)
Lab: 135 U/L — ABNORMAL HIGH (ref 7–40)
Lab: 148 MMOL/L — ABNORMAL HIGH (ref 137–147)
Lab: 15 K/UL — ABNORMAL HIGH (ref 3–12)
Lab: 17 mg/dL (ref 7–25)
Lab: 275 U/L — ABNORMAL HIGH (ref 7–56)
Lab: 29 MMOL/L (ref >60)
Lab: 4.8 g/dL — ABNORMAL HIGH (ref 3.5–5.0)
Lab: 7.8 g/dL — ABNORMAL HIGH (ref 6.0–8.0)
Lab: 8.2 mg/dL — ABNORMAL LOW (ref 8.5–10.6)
Lab: 99 U/L (ref 25–110)
Lab: >60 mL/min (ref >60)

## 2020-12-05 LAB — TROPONIN-I
Lab: 0 ng/mL (ref 0.0–0.05)
Lab: 0 ng/mL (ref 0.0–0.05)

## 2020-12-05 LAB — POC GLUCOSE: Lab: 109 mg/dL — ABNORMAL HIGH (ref 70–100)

## 2020-12-05 LAB — POC TROPONIN: Lab: 0 ng/mL — ABNORMAL HIGH (ref 0.00–0.05)

## 2020-12-05 MED ORDER — LORAZEPAM 2 MG/ML IJ SOLN
1 mg | Freq: Once | INTRAVENOUS | 0 refills | Status: CP
Start: 2020-12-05 — End: ?
  Administered 2020-12-05: 23:00:00 1 mg via INTRAVENOUS

## 2020-12-05 NOTE — ED Notes
Report from [place], [place]. Patient resting comfortably. CO at bedside for patient safety.

## 2020-12-05 NOTE — ED Notes
Tech that obtained vitals reported to this RN that patient answered yes to feeling unsafe at home. When this RN asked patient if anyone was threatening or physically hurting him at home, patient answered no.

## 2020-12-06 NOTE — ED Notes
This RN provides discharge teaching and instructions. Pt AAOx4, verbalizes understanding and denies further questions. Pt ambulates off unit with an even, steady gait in no acute distress. All belongings and paperwork in patient possession upon departure.

## 2020-12-06 NOTE — Progress Notes
[facility]     Multiple Visit Patient - Case Management Note      NAME: Daniel French MRN: [id number] DOB: June 26, 1971  AGE: 50 y.o.    Admits in past 12 mos: ED Visits: 10 Admissions: 6     Source of Information: EMR, Detox    Primary MVP Case Manager: Tonna Boehringer     Reporting Out: Coordinated with ED team, [place] detox facilities and unable to meet with Pt due to intoxication.     Driver of Utilization: Behavioral; substance use, mental health; Social; basic needs, social determinants of health.    Disposition: Unknown     Interventions    Coordinated with ED team on multi visit patient status, recent attempts to discharge to detox or shelter and plan to contact detox again.    Barriers to obtaining detox bed include availability and Pt uninsured status.   [facility]  no beds   o [facility]  has beds, Pt is uninsured, cannot accept   o [facility]  Pt is uninsured; cannot accept   o [facility]  detox beds are being used for COVID Pts for foreseeable future   o [facility] is uninsured; out of pocket cost is $850 per day   o [facility]  will not accept transfer from [place] not a referring partner   o [facility]  no beds until possibly Saturday    Made ED team aware of above information. If Pt is stable for ED discharge, plan to offer transportation to warming shelter for the night. Coordinated with Loma Sousa, ED SW regarding potential needs.     Next Steps   Ongoing referrals to outpatient detox as appropriate    Consider completion of RADAC assessment at bedside due to Pt not having a phone    Referral to [facility] for [place] or [place] Medicaid application pending Pt more permanent housing location    Contact [facility] to determine Pt eligibility to return upon completion of detox   Connect to mental health resources     Neila Gear  Cell Phone: [phone] or Hetty Ely

## 2020-12-06 NOTE — Case Management (ED)
Case Management Progress Note    NAME:Daniel French                          MRN: [id number]              DOB:Sep 10, 1971          AGE: 50 y.o.  ADMISSION DATE: 12/05/2020             DAYS ADMITTED: LOS: 0 days      Todays Date: 12/05/2020    Plan  Pt to discharge to [facility] via cab.    Interventions   Support  SW contacted by MD to assist pt with discharge housing.     Info or Referral  SW reviewed EMR prior to meeting with pt at bedside.  Pt reports he has no housing options.   SW inquired if pt is willing to go to homeless shelter, pt reports he is, and has never been to a shelter.  Pt provided consent for SW to seek shelter on his behalf.    SW contacted [facility] to request bed.  Pt's name and DOB provided.    Pt approved for shelter.    SW provided cab voucher to location.  Pt informed, asked to wait in ED waiting room.    SW updated MD of above.    No further needs at this time.      Discharge Planning       Medication Needs     Financial       Legal       Other        Disposition   Expected Discharge Date        Transportation       Next Level of Care (Acute Psych discharges only)       Discharge Disposition       Selected Continued Care - Admitted Since 12/05/2020    No services have been selected for the patient.       Princess Bruins, LMSW  ED Social Work  [facility]

## 2020-12-06 NOTE — ED Notes
Pt ambulating with a steady gait to the restroom. Pt A&O x4

## 2020-12-08 ENCOUNTER — Encounter: Admit: 2020-12-08 | Discharge: 2020-12-08 | Primary: Family

## 2020-12-08 ENCOUNTER — Emergency Department: Admit: 2020-12-08 | Discharge: 2020-12-08 | Disposition: A | Discharge status: Home / Self Care

## 2020-12-08 DIAGNOSIS — F10220 Alcohol dependence with intoxication, uncomplicated: Secondary | ICD-10-CM

## 2020-12-08 LAB — CBC AND DIFF
Lab: 0 K/UL (ref 0–0.45)
Lab: 0.1 K/UL (ref 0–0.20)
Lab: 0.4 K/UL (ref 0–0.80)
Lab: 20 (ref <20.7)
Lab: 7.5 K/UL (ref 4.5–11.0)

## 2020-12-08 LAB — COMPREHENSIVE METABOLIC PANEL
Lab: 0.5 mg/dL (ref 0.4–1.24)
Lab: 0.6 mg/dL (ref 0.3–1.2)
Lab: 102 MMOL/L — ABNORMAL LOW (ref 98–110)
Lab: 112 U/L — ABNORMAL HIGH (ref 25–110)
Lab: 112 U/L — ABNORMAL HIGH (ref 7–40)
Lab: 142 MMOL/L — ABNORMAL LOW (ref 137–147)
Lab: 16 K/UL — ABNORMAL HIGH (ref 3–12)
Lab: 16 mg/dL (ref 7–25)
Lab: 164 U/L — ABNORMAL HIGH (ref 7–56)
Lab: 24 MMOL/L (ref 21–30)
Lab: 4.3 g/dL (ref 3.5–5.0)
Lab: 7.2 g/dL — ABNORMAL HIGH (ref 6.0–8.0)
Lab: 7.9 mg/dL — ABNORMAL LOW (ref 8.5–10.6)
Lab: 95 mg/dL (ref 70–100)
Lab: >60 mL/min (ref >60)

## 2020-12-08 LAB — MAGNESIUM: Lab: 2.1 mg/dL — ABNORMAL LOW (ref 1.6–2.6)

## 2020-12-08 LAB — COVID-19 (SARS-COV-2) PCR

## 2020-12-08 MED ORDER — LACTATED RINGERS IV SOLP
1000 mL | Freq: Once | INTRAVENOUS | 0 refills | Status: CP
Start: 2020-12-08 — End: ?
  Administered 2020-12-08: 10:00:00 1000 mL via INTRAVENOUS

## 2020-12-08 MED ORDER — PHENOBARBITAL SODIUM 130 MG/ML IJ SOLN
260 mg | Freq: Once | INTRAVENOUS | 0 refills | Status: CP
Start: 2020-12-08 — End: ?
  Administered 2020-12-08: 10:00:00 260 mg via INTRAVENOUS

## 2020-12-08 MED ORDER — THIAMINE/FOLIC ACID IVPB
Freq: Once | INTRAVENOUS | 0 refills | Status: CP
Start: 2020-12-08 — End: ?
  Administered 2020-12-08 (×3): 50.000 mL via INTRAVENOUS

## 2020-12-08 NOTE — ED Notes
Psychiatric Liaison Services Evaluation:    Name: Daniel French        MRN: [id number]          DOB: November 20, 1971          Age: 50 y.o.  Admission Date: 12/08/2020             LOS: 0 days      Gender:  Male  Referred by:  Self  Accompanied by: Self    Chief Complaint:  ETOH    History of Present Illness:  Pt presented to [facility] with complaints of alcohol intoxication and requesting services.    PLS SW was asked to speak to Pt re: available services and resources for Pt.  Pt stated that he had been sober for a good Orf time but relapsed in November 2021.  Pt stated that since then, it's been a roller coaster of failed attempts to stay sober.  Pt reported that he has a large number of family members that are willing to help him but Pt has to show that he has some sober time under his belt prior to family being willing to offer aid.      Pt denied SI/HI/AH/VH.  Pt noted mild tremors in his hands and mold anxiety at the time of interview.  Pt denied any psychiatric history or needs at this time.  Pt stated he needed help with services that can get him back on his feet and help him with housing until his family is willing to step in and support him.      SW and Pt discussed available services and resources.  Pt decided to go to a sobering bed at [facility] today and talk to them about ongoing support and services.      Psych History: None    Past Medical History:   Medical History:   Diagnosis Date   ? Alcoholism (HCC)    ? Depression    ? Herniation of lumbar intervertebral disc    ? Nephrolithiasis    ? Pancreatitis    ? PTSD (post-traumatic stress disorder)    ? Seizure [facility])        Past Surgical History:   Surgical History:   Procedure Laterality Date   ? HX APPENDECTOMY     ? KIDNEY STONE SURGERY         Substance Abuse History:  Social History     Tobacco Use   ? Smoking status: Never Smoker   ? Smokeless tobacco: Current User     Types: Chew   Vaping Use   ? Vaping Use: Never used   Substance Use Topics   ? Alcohol use: Yes     Alcohol/week: 0.0 standard drinks   ? Drug use: Yes     Types: Methamphetamines, Cocaine, Heroin     Comment: pills, a baggie of some brown stuff.     Social History     Substance and Sexual Activity   Drug Use Yes   ? Types: Methamphetamines, Cocaine, Heroin    Comment: pills, a baggie of some brown stuff.       Family History:  Family History   Problem Relation Age of Onset   ? Heart Attack Other    ? Hypertension Other    ? None Reported Mother    ? Hypertension Father    ? Cancer Father    ? None Reported Sister    ? None Reported Brother    ? None Reported  Sister    ? None Reported Brother        Allergies:  No Known Allergies    Medications:    (Not in a hospital admission)      Social History:   Marital Status: Divorced    Living Situation: Homeless    Developmental History:  Unremarkable     Mental Status Examination:  Flow of Thought: Normal  Intellect: Normal  Sensorium: Normal  Memory: Normal  Insight / Judgment: Fair judgment, Fair insight  General Appearance: Normal  Behavior: Cooperative, Calm  Motor Activity: Normal  Speech: Normal  Mood / Affect: Anxious, Congruent  Content Of Thought: Denies auditory hallucinations, Denies homicidal thoughts, Denies suicidal ideation, Denies visual hallucinations        Conduct Disturbance: Normal  Eating/Sleep Disturbance: Normal  Interview Behavior: Normal  Med/TX Compliance: Meds-no, TX-no    Suicide Risk Initial Screening:Low    Suicide Risk Assesssment: Low    Disposition: Pt will be transferred to a sobering bed at [facility].     Reviewed: Durwin Nora    Attending Physician:  Manson Passey

## 2020-12-08 NOTE — ED Notes
PLS SW faxed labs to [facility]

## 2020-12-08 NOTE — ED Provider Notes
Daniel French is a 50 y.o. male.    Chief Complaint:  Chief Complaint   Patient presents with   ? Alcohol intoxication     last drink around 1600 had a total of a 5th, reports he is afraid he is going to have a seizure        History of Present Illness:  Patient is a 50 year old male with past medical history of polysubstance abuse as well as alcohol abuse that is presenting to the emergency department intoxicated.  Patient states that his last drink was around 4:00 yesterday and he would like to detox.  Patient denies any other concerns or complaints at this time.  Patient denies any recent trauma.  Patient denies any recent seizures.  Patient denies suicidal ideation, homicidal ideation, and hallucinations.          Review of Systems:  Review of Systems   Constitutional: Negative for chills and fever.   HENT: Negative for sinus pressure and sore throat.    Eyes: Negative for pain and discharge.   Respiratory: Negative for cough and shortness of breath.    Cardiovascular: Negative for chest pain and palpitations.   Gastrointestinal: Negative for abdominal pain, constipation, diarrhea, nausea and vomiting.   Genitourinary: Negative for dysuria and frequency.   Musculoskeletal: Negative for arthralgias and back pain.   Skin: Negative for rash and wound.   Neurological: Negative for dizziness and headaches.   Psychiatric/Behavioral: Negative for agitation and confusion.       Allergies:  Patient has no known allergies.    Past Medical History:  Medical History:   Diagnosis Date   ? Alcoholism (HCC)    ? Depression    ? Herniation of lumbar intervertebral disc    ? Nephrolithiasis    ? Pancreatitis    ? PTSD (post-traumatic stress disorder)    ? Seizure [facility])        Past Surgical History:  Surgical History:   Procedure Laterality Date   ? HX APPENDECTOMY     ? KIDNEY STONE SURGERY             Social History:  Social History     Tobacco Use   ? Smoking status: Never Smoker   ? Smokeless tobacco: Current User Types: Chew   Vaping Use   ? Vaping Use: Never used   Substance Use Topics   ? Alcohol use: Yes     Alcohol/week: 0.0 standard drinks   ? Drug use: Yes     Types: Methamphetamines, Cocaine, Heroin     Comment: pills, a baggie of some brown stuff.     Social History     Substance and Sexual Activity   Drug Use Yes   ? Types: Methamphetamines, Cocaine, Heroin    Comment: pills, a baggie of some brown stuff.             Family History:  Family History   Problem Relation Age of Onset   ? Heart Attack Other    ? Hypertension Other    ? None Reported Mother    ? Hypertension Father    ? Cancer Father    ? None Reported Sister    ? None Reported Brother    ? None Reported Sister    ? None Reported Brother        Vitals:  ED Vitals    Date and Time T BP P RR SPO2P SPO2 User   12/08/20 1159 -- 151/73 -- --  97 -- RC   12/08/20 0855 -- -- -- -- 82 98 % RC   12/08/20 0604 -- -- -- -- 76 100 % ES   12/08/20 0308 -- 163/82 -- 16 PER MINUTE 80 100 % GE   12/08/20 0107 36.8 ?C (98.2 ?F) 147/87 -- -- 104 99 % TC          Physical Exam:  Physical Exam  Vitals and nursing note reviewed.   Constitutional:       General: He is not in acute distress.     Appearance: Normal appearance. He is normal weight. He is not ill-appearing.   HENT:      Head: Normocephalic and atraumatic.      Right Ear: External ear normal.      Left Ear: External ear normal.   Eyes:      General:         Right eye: No discharge.         Left eye: No discharge.      Extraocular Movements: Extraocular movements intact.      Conjunctiva/sclera: Conjunctivae normal.   Cardiovascular:      Rate and Rhythm: Normal rate and regular rhythm.      Pulses: Normal pulses.      Heart sounds: Normal heart sounds.   Pulmonary:      Effort: Pulmonary effort is normal.      Breath sounds: Normal breath sounds. No wheezing.   Abdominal:      General: Abdomen is flat. There is no distension.      Palpations: Abdomen is soft.      Tenderness: There is no abdominal tenderness. Musculoskeletal:         General: No swelling or tenderness. Normal range of motion.      Cervical back: Normal range of motion and neck supple. No muscular tenderness.   Skin:     General: Skin is warm and dry.   Neurological:      General: No focal deficit present.      Mental Status: He is alert and oriented to person, place, and time. Mental status is at baseline.   Psychiatric:      Comments: Appears intoxicated, conversational and following commands         Laboratory Results:  Labs Reviewed   CBC AND DIFF - Abnormal       Result Value Ref Range Status    White Blood Cells 7.5  4.5 - 11.0 K/UL Final    RBC 4.04 (*) 4.4 - 5.5 M/UL Final    Hemoglobin 12.9 (*) 13.5 - 16.5 GM/DL Final    Hematocrit 16.1 (*) 40 - 50 % Final    MCV 91.1  80 - 100 FL Final    MCH 31.9  26 - 34 PG Final    MCHC 35.1  32.0 - 36.0 G/DL Final    RDW 09.6  11 - 15 % Final    Platelet Count 429 (*) 150 - 400 K/UL Final    MPV 7.2  7 - 11 FL Final    Neutrophils 65  41 - 77 % Final    Lymphocytes 29  24 - 44 % Final    Monocytes 5  4 - 12 % Final    Eosinophils 0  0 - 5 % Final    Basophils 1  0 - 2 % Final    Absolute Neutrophil Count 4.80  1.8 - 7.0 K/UL Final    Absolute Lymph  Count 2.20  1.0 - 4.8 K/UL Final    Absolute Monocyte Count 0.40  0 - 0.80 K/UL Final    Absolute Eosinophil Count 0.00  0 - 0.45 K/UL Final    Absolute Basophil Count 0.10  0 - 0.20 K/UL Final    MDW (Monocyte Distribution Width) 20.0  <20.7 Final   COMPREHENSIVE METABOLIC PANEL - Abnormal    Sodium 142  137 - 147 MMOL/L Final    Potassium 4.4  3.5 - 5.1 MMOL/L Final    Chloride 102  98 - 110 MMOL/L Final    Glucose 95  70 - 100 MG/DL Final    Blood Urea Nitrogen 16  7 - 25 MG/DL Final    Creatinine 1.61  0.4 - 1.24 MG/DL Final    Calcium 7.9 (*) 8.5 - 10.6 MG/DL Final    Total Protein 7.2  6.0 - 8.0 G/DL Final    Total Bilirubin 0.6  0.3 - 1.2 MG/DL Final    Albumin 4.3  3.5 - 5.0 G/DL Final    Alk Phosphatase 112 (*) 25 - 110 U/L Final    AST (SGOT) 112 (*) 7 - 40 U/L Final    CO2 24  21 - 30 MMOL/L Final    ALT (SGPT) 164 (*) 7 - 56 U/L Final    Anion Gap 16 (*) 3 - 12 Final    eGFR >60  >60 mL/min Final   COVID-19 (SARS-COV-2) PCR    COVID-19 (SARS-CoV-2) PCR Source     Corrected    Value: FLOCKED SWAB  NASOPHARYNGEAL      COVID-19 (SARS-CoV-2) PCR NOT DETECTED  DN-NOT DETECTED Final   MAGNESIUM    Magnesium 2.1  1.6 - 2.6 mg/dL Final          Radiology Interpretation:    No orders to display       ED Course:  Patient is a 50 y.o. male presenting with the chief complaint of intoxication.  Pt seen and evaluated by resident and attending at bedside.  Previous records reviewed.   Patient's initial vitals reviewed.   PIV placed, labs drawn, patient on monitor.  Differential diagnosis includes but is not limited to: Intoxication  Course  ?Physical exam significant for the findings above  ?Labs ordered for further evaluation of patient symptoms  ?Patient given phenobarbital for concern for alcohol withdrawal  ?Patient given IV fluids and vitamins  ?Labs unremarkable, PLS consulted for outpatient resources for alcohol withdrawal  ?Patient will be discharged to a sobering bed at [facility]    Disposition: Discharge.     MDM  Reviewed: previous chart, nursing note and vitals  Reviewed previous: labs  Interpretation: labs        Facility Administered Meds:  Medications   lactated ringers infusion (0 mL Intravenous Infusion Stopped 12/08/20 0716)   thiamine (VITAMIN B-1) 200 mg, folic acid 1 mg in sodium chloride 0.9% (NS) 50 mL IVPB ( Intravenous Infusion Stopped 12/08/20 0549)   PHENobarbital injection 260 mg (260 mg Intravenous Given 12/08/20 0407)         Clinical Impression:  Clinical Impression   Acute alcoholic intoxication in alcoholism without complication [facility])       Disposition/Follow up  ED Disposition     ED Disposition    Discharge        Lorenza Evangelist, APRN-NP  [address]  [place] [address]  [phone]    Schedule an appointment as soon as possible for a  visit [facility]  [address]  [place] [address]  [phone]  Go to  [facility] has outpatient programs for substance abuse and mental health services    [facility]  [address]  [place], [place] [address]  [phone]  Go to  [facility] has sobering/detox beds as well as outpatient services for substance abuse.     [facility]  [address]  [place], [place] [address]  [phone] - Crisis line  [phone] - Appointment Line  Call  [facility] is a resource for [employer].  Crisis walk-in services are available 24/7    [facility]  [address] [place] [address]  [phone]  Go today  [facility] has mental health and sobering beds for crisis.  Crisis walk/in is 24/7.       Medications:  Discharge Medication List as of 12/08/2020 11:55 AM          Procedure Notes:  Procedures        Attestation / Supervision:        Scot Dock, DO

## 2020-12-08 NOTE — ED Notes
Pt A&Ox4 and given discharge instructions and follow up information. Pt verbalizes understanding and has no further questions or complaints. VSS. Pt ambulated out of ED w/ steady gait to go home by cab pass.

## 2020-12-08 NOTE — ED Notes
SW spoke to Pella at [facility].  Pt can self-admit for sobering at this time.

## 2020-12-30 ENCOUNTER — Emergency Department: Admit: 2020-12-30 | Discharge: 2020-12-30

## 2020-12-30 ENCOUNTER — Encounter: Admit: 2020-12-30 | Discharge: 2020-12-30 | Primary: Family

## 2020-12-30 ENCOUNTER — Emergency Department: Admit: 2020-12-30 | Discharge: 2020-12-31

## 2020-12-30 DIAGNOSIS — F4310 Post-traumatic stress disorder, unspecified: Secondary | ICD-10-CM

## 2020-12-30 DIAGNOSIS — N200 Calculus of kidney: Secondary | ICD-10-CM

## 2020-12-30 DIAGNOSIS — F32A Depression: Secondary | ICD-10-CM

## 2020-12-30 DIAGNOSIS — R569 Unspecified convulsions: Secondary | ICD-10-CM

## 2020-12-30 DIAGNOSIS — F1020 Alcohol dependence, uncomplicated: Secondary | ICD-10-CM

## 2020-12-30 DIAGNOSIS — F10129 Alcohol abuse with intoxication, unspecified: Secondary | ICD-10-CM

## 2020-12-30 DIAGNOSIS — K859 Acute pancreatitis without necrosis or infection, unspecified: Secondary | ICD-10-CM

## 2020-12-30 DIAGNOSIS — M5126 Other intervertebral disc displacement, lumbar region: Secondary | ICD-10-CM

## 2020-12-30 LAB — CBC AND DIFF
Lab: 16 % — ABNORMAL HIGH (ref 11–15)
Lab: 17 ng/mL (ref <20.7)
Lab: 31 pg (ref 26–34)
Lab: 33 g/dL (ref 32.0–36.0)
Lab: 4 M/UL — ABNORMAL LOW (ref 4.4–5.5)
Lab: 4.1 K/UL — ABNORMAL LOW (ref 4.5–11.0)

## 2020-12-30 LAB — COMPREHENSIVE METABOLIC PANEL
Lab: 110 MMOL/L — ABNORMAL LOW (ref 98–110)
Lab: 13 MMOL/L — ABNORMAL HIGH (ref 3–12)
Lab: 171 mg/dL — ABNORMAL HIGH (ref 70–100)
Lab: 25 MMOL/L (ref 21–30)
Lab: 3.6 MMOL/L — ABNORMAL LOW (ref 3.5–5.1)
Lab: 4.5 g/dL — ABNORMAL LOW (ref 3.5–5.0)
Lab: 7.1 g/dL (ref 6.0–8.0)

## 2020-12-30 LAB — ALCOHOL LEVEL: Lab: 334 mg/dL

## 2020-12-30 MED ORDER — LACTATED RINGERS IV SOLP
1000 mL | Freq: Once | INTRAVENOUS | 0 refills | Status: CP
Start: 2020-12-30 — End: ?
  Administered 2020-12-30: 16:00:00 1000 mL via INTRAVENOUS

## 2020-12-30 MED ORDER — THIAMINE/FOLIC ACID IVPB
Freq: Once | INTRAVENOUS | 0 refills | Status: CP
Start: 2020-12-30 — End: ?
  Administered 2020-12-30 (×3): 50.000 mL via INTRAVENOUS

## 2020-12-30 MED ORDER — PHENOBARBITAL SODIUM 130 MG/ML IJ SOLN
260 mg | Freq: Once | INTRAVENOUS | 0 refills | Status: CP
Start: 2020-12-30 — End: ?
  Administered 2020-12-30: 16:00:00 260 mg via INTRAVENOUS

## 2020-12-30 NOTE — ED Provider Notes
Daniel French is a 50 y.o. male.    Chief Complaint:  Chief Complaint   Patient presents with   ? Alcohol intoxication       History of Present Illness:  Patient is a 50 year old male with past medical history of alcoholism, seizures, and depression with frequent visits to this institution who presents today with alcohol intoxication.  Patient was brought in by EMS after being found down outside, and patient endorsed drinking a lot before he presented to the ED.  When initially talking the patient, he does not remember being outside, and is not able to quantify how much he drank or when he stopped.  He was not complaining of any pain other than some left-sided neck pain.  Patient was verbal, and he was only oriented to person and year.  He did not have any obvious injuries or deformities noted.  Patient was able to protect his airway.  When asked, patient states that he does not have any concerns whatsoever, and he kept quoting various movie lines and asking staff if they had seen those movies.          Review of Systems:  Review of Systems   Reason unable to perform ROS: Patient intoxicated.       Allergies:  Patient has no known allergies.    Past Medical History:  Medical History:   Diagnosis Date   ? Alcoholism (HCC)    ? Depression    ? Herniation of lumbar intervertebral disc    ? Nephrolithiasis    ? Pancreatitis    ? PTSD (post-traumatic stress disorder)    ? Seizure [facility])        Past Surgical History:  Surgical History:   Procedure Laterality Date   ? HX APPENDECTOMY     ? KIDNEY STONE SURGERY         Pertinent medical and surgical history reviewed    Social History:  Social History     Tobacco Use   ? Smoking status: Never Smoker   ? Smokeless tobacco: Current User     Types: Chew   Vaping Use   ? Vaping Use: Never used   Substance Use Topics   ? Alcohol use: Yes     Alcohol/week: 0.0 standard drinks   ? Drug use: Yes     Types: Methamphetamines, Cocaine, Heroin     Comment: pills, a baggie of some brown stuff.     Social History     Substance and Sexual Activity   Drug Use Yes   ? Types: Methamphetamines, Cocaine, Heroin    Comment: pills, a baggie of some brown stuff.             Family History:  Family History   Problem Relation Age of Onset   ? Heart Attack Other    ? Hypertension Other    ? None Reported Mother    ? Hypertension Father    ? Cancer Father    ? None Reported Sister    ? None Reported Brother    ? None Reported Sister    ? None Reported Brother        Vitals:  ED Vitals    Date and Time T BP P RR SPO2P SPO2 User   12/30/20 1500 -- -- 70 16 PER MINUTE 71 96 % RT   12/30/20 1400 -- -- 83 19 PER MINUTE 79 100 % RT   12/30/20 1330 -- -- 77 20 PER MINUTE 78  96 % MN   12/30/20 1300 -- -- 80 19 PER MINUTE -- -- MN   12/30/20 1030 -- 104/69 76 19 PER MINUTE 76 95 % RT   12/30/20 1000 -- 123/84 79 25 PER MINUTE 78 97 % RT   12/30/20 0944 -- -- 90 17 PER MINUTE 90 95 % NW   12/30/20 0943 -- 121/72 94 -- -- -- NW   12/30/20 0942 36.7 ?C (98.1 ?F) -- -- -- -- -- NW          Physical Exam:  Physical Exam  Constitutional:       Appearance: He is not ill-appearing or toxic-appearing.      Comments: Intoxicated appearing   HENT:      Head: Normocephalic and atraumatic.      Comments:  chronic scar over patient's right forehead.  He does not have any other obvious injuries or wounds present.     Right Ear: Tympanic membrane normal. There is impacted cerumen.      Left Ear: Tympanic membrane normal. There is impacted cerumen.      Nose: Nose normal. No congestion or rhinorrhea.      Mouth/Throat:      Mouth: Mucous membranes are moist.      Pharynx: Oropharynx is clear. No oropharyngeal exudate or posterior oropharyngeal erythema.   Eyes:      Extraocular Movements: Extraocular movements intact.      Conjunctiva/sclera: Conjunctivae normal.      Pupils: Pupils are equal, round, and reactive to light.   Cardiovascular:      Rate and Rhythm: Normal rate and regular rhythm.      Pulses: Normal pulses. Heart sounds: Normal heart sounds. No murmur heard.   No friction rub. No gallop.    Pulmonary:      Effort: Pulmonary effort is normal.      Breath sounds: Normal breath sounds. No wheezing.   Abdominal:      General: Abdomen is flat. Bowel sounds are normal.      Tenderness: There is no abdominal tenderness.   Musculoskeletal:      Cervical back: No tenderness.      Right lower leg: No edema.      Left lower leg: No edema.   Lymphadenopathy:      Cervical: No cervical adenopathy.   Skin:     General: Skin is warm and dry.      Capillary Refill: Capillary refill takes less than 2 seconds.      Findings: No erythema or lesion.   Neurological:      General: No focal deficit present.      Mental Status: Mental status is at baseline. He is disoriented.   Psychiatric:         Mood and Affect: Mood normal.         Behavior: Behavior normal.         Laboratory Results:  Labs Reviewed   CBC AND DIFF - Abnormal       Result Value Ref Range Status    White Blood Cells 4.1 (*) 4.5 - 11.0 K/UL Final    RBC 4.06 (*) 4.4 - 5.5 M/UL Final    Hemoglobin 12.8 (*) 13.5 - 16.5 GM/DL Final    Hematocrit 16.1 (*) 40 - 50 % Final    MCV 93.7  80 - 100 FL Final    MCH 31.6  26 - 34 PG Final    MCHC 33.8  32.0 - 36.0 G/DL  Final    RDW 16.0 (*) 11 - 15 % Final    Platelet Count 303  150 - 400 K/UL Final    MPV 6.6 (*) 7 - 11 FL Final    Neutrophils 56  41 - 77 % Final    Lymphocytes 36  24 - 44 % Final    Monocytes 6  4 - 12 % Final    Eosinophils 1  0 - 5 % Final    Basophils 1  0 - 2 % Final    Absolute Neutrophil Count 2.32  1.8 - 7.0 K/UL Final    Absolute Lymph Count 1.47  1.0 - 4.8 K/UL Final    Absolute Monocyte Count 0.22  0 - 0.80 K/UL Final    Absolute Eosinophil Count 0.04  0 - 0.45 K/UL Final    Absolute Basophil Count 0.03  0 - 0.20 K/UL Final    MDW (Monocyte Distribution Width) 17.2  <20.7 Final   COMPREHENSIVE METABOLIC PANEL - Abnormal    Sodium 148 (*) 137 - 147 MMOL/L Final    Potassium 3.6  3.5 - 5.1 MMOL/L Final Chloride 110  98 - 110 MMOL/L Final    Glucose 171 (*) 70 - 100 MG/DL Final    Blood Urea Nitrogen 8  7 - 25 MG/DL Final    Creatinine 1.61  0.4 - 1.24 MG/DL Final    Calcium 8.7  8.5 - 10.6 MG/DL Final    Total Protein 7.1  6.0 - 8.0 G/DL Final    Total Bilirubin 0.3  0.3 - 1.2 MG/DL Final    Albumin 4.5  3.5 - 5.0 G/DL Final    Alk Phosphatase 86  25 - 110 U/L Final    AST (SGOT) 29  7 - 40 U/L Final    CO2 25  21 - 30 MMOL/L Final    ALT (SGPT) 45  7 - 56 U/L Final    Anion Gap 13 (*) 3 - 12 Final    eGFR >60  >60 mL/min Final   ALCOHOL LEVEL          Radiology Interpretation:    CT HEAD WO CONTRAST   Final Result         Head:       No acute intracranial hemorrhage or calvarial fracture.      Cervical Spine:       1.  No evidence of acute cervical fracture or traumatic subluxation.   2.  Congenital C5-C6 ankylosis.   3.  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             Approved by Rene Paci, M.D. on 12/30/2020 11:54 AM      By my electronic signature, I attest that I have personally reviewed the images for this examination and formulated the interpretations and opinions expressed in this report          Finalized by Desma Maxim, M.D. on 12/30/2020 11:55 AM. Dictated by Rene Paci, M.D. on 12/30/2020 11:41 AM.         CT SPINE CERVICAL WO CONTRAST   Final Result         Head:       No acute intracranial hemorrhage or calvarial fracture.      Cervical Spine:       1.  No evidence of acute cervical fracture or traumatic subluxation.   2.  Congenital C5-C6 ankylosis.  3.  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             Approved by Rene Paci, M.D. on 12/30/2020 11:54 AM      By my electronic signature, I attest that I have personally reviewed the images for this examination and formulated the interpretations and opinions expressed in this report          Finalized by Desma Maxim, M.D. on 12/30/2020 11:55 AM. Dictated by Rene Paci, M.D. on 12/30/2020 11:41 AM.               EKG:  Not applicable    ED Course:  50 y.o. male presented to the ED for alcohol intoxication patient.  - Patient was evaluated by resident and attending physicians.  - Available records were reviewed.  - Vitals: BP 104/69  - Pulse 70  - Temp 36.7 ?C (98.1 ?F)  - Ht 182.9 cm (72)  - Wt 79.4 kg (175 lb)  - SpO2 96%  - BMI 23.73 kg/m?   -Patient brought in due to alcohol intoxication and being found down.  Patient unable to relay his previous history before arrival to the ED.  Given his history of alcohol withdrawal mixed with his presentation today, patient was given phenobarbital to help with possible withdrawal symptoms, as well as lactated Ringer's, thiamine, and folic acid.  Head CT and neck were also ordered to assess for possible injury.  Basic labs were drawn.  -Labs: CBC shows chronic unchanged anemia.  No leukocytosis suggestive of underlying infection.  CMP shows no electrolyte, kidney, or liver abnormalities.  - Imaging: Head and neck CTs with no acute intracranial or bony injury or abnormality.  -Patient was reevaluated numerous times over his emergency department stay.  After couple of hours, patient was able to talk to social work.  At that time, he was agreeable to going to [facility] for alcohol treatment.  [facility] requires alcohol levels, and an order was put in for this.  Social work will continue to work with the patient to get him transfer to this facility.  -On numerous reevaluations, patient was not showing signs of alcohol withdrawal.  He was not having tachycardia, diaphoresis, or visual hallucinations.  - at time of handoff, patient was awaiting alcohol level in order to go to [facility].  Patient case was discussed with Dr. Allena Katz.  At that time, care was transferred to them.  Please read their note for further work-up and management of this patient.            ED Scoring:                           Coding    Facility Administered Meds:  Medications   lactated ringers infusion (0 mL Intravenous Infusion Stopped 12/30/20 1110)   PHENobarbital injection 260 mg (260 mg Intravenous Given 12/30/20 1016)   thiamine (VITAMIN B-1) 200 mg, folic acid 1 mg in sodium chloride 0.9% (NS) 50 mL IVPB ( Intravenous Infusion Stopped 12/30/20 1211)         Clinical Impression:  Clinical Impression   None       Disposition/Follow up  ED Disposition     None        No follow-up provider specified.    Medications:  New Prescriptions    No medications on file       Procedure Notes:  Procedures        Attestation / Supervision:      Perley Jain, MD  Emergency Medicine PGY-1    Willette Cluster, MD

## 2020-12-30 NOTE — ED Notes
Assumed care of pt; Pt alert, sleeping in bed, respirations even and non-labored. Pt not oriented.  Skin is warm, dry, intact and appropriate for ethnicity.  Bed is in lowest locked position, call light within reach. Pt dressed in yellow gown; CO at the door, and belongings removed from room.

## 2020-12-30 NOTE — Progress Notes
Continuum of Care     Multiple Visit Patient - Case Management Note      NAME: Daniel French MRN: [id number] DOB: [birth year]  AGE: 50 y.o.    Admits in past 12 mos: ED Visits: 12 Admissions: 6     Source of Information: Pt, EMR, [facility]    Primary MVP Case Manager: Kirby Funk    Reporting Out: SW coordinated with ED team, [facility], Pts friend  Merry Proud and met with Pt at bedside.     Driver of Utilization: Suspect - Behavioral; substance use, mental health; Social; basic needs, social determinants of health.    Disposition: ED Discharge to [facility]      Interventions    SW coordinated with ED team on Pt presentation, pattern of concern and discharge/detox options    SW contacted [facility] [phone]. Informed that they have male sobering beds available. SW faxed referral [phone] and coordinated further regarding BAL (334 at [phone]) and dose of phenobarbital administered in ED. Pt accepted.    Coordinated with Pts friend Merry Proud in [place]. Informed of POC to discharge to [facility]. Provided phone to facilitate conversation between Merry Proud and Quintin and charged Pts cell at [employer] station.    Provided cab pass for transportation to [facility].    Pt too intoxicated to engage in meaningful conversation / assessment throughout day to further discuss ongoing substance use treatment, housing and linkage to [place].     Next Steps   Ongoing referrals to outpatient detox as appropriate    Consider completion of RADAC assessment at bedside due to Pt not having a phone    Referral to [employer] for  or [place] Medicaid application pending Pt more permanent housing location    Contact [facility] to determine Pt eligibility to return upon completion of detox   Connect to mental health resources       Neila Gear  Cell Phone: [phone] or Hetty Ely

## 2020-12-30 NOTE — ED Notes
Pt is a 50 year old male who presents to [facility] by PD. Pt was found laying in front of the hospital belligerently intoxicated when found by PD who brought him in to be evaluated. Pt A&Ox2. States he had his last drink 2 hours ago. Unable to state how much or what he drank. Endorses pmh of withdrawal seizures.  Cooperative with this RN.    Pt placed on cardiac, BP, and SpO2 monitor. Bed in lowest and locked position. Side rails up x2. Transferred to ED39. Report given to RN, Wells Guiles.

## 2020-12-31 ENCOUNTER — Emergency Department: Admit: 2020-12-31 | Discharge: 2020-12-31 | Disposition: A | Discharge status: Home / Self Care

## 2020-12-31 ENCOUNTER — Encounter: Admit: 2020-12-31 | Discharge: 2020-12-31 | Primary: Family

## 2020-12-31 ENCOUNTER — Emergency Department: Admit: 2020-12-31 | Discharge: 2020-12-31

## 2020-12-31 ENCOUNTER — Emergency Department: Admit: 2020-12-31 | Discharge: 2020-12-31 | Discharge status: Against Medical Advice

## 2020-12-31 DIAGNOSIS — F10220 Alcohol dependence with intoxication, uncomplicated: Secondary | ICD-10-CM

## 2020-12-31 LAB — CBC AND DIFF
Lab: 0 % (ref 0–2)
Lab: 0 % (ref 0–5)
Lab: 0 K/UL (ref 0–0.20)
Lab: 0 K/UL (ref 0–0.45)
Lab: 0.4 K/UL (ref 0–0.80)
Lab: 11 K/UL — ABNORMAL HIGH (ref 4.5–11.0)
Lab: 15 g/dL (ref 13.5–16.5)
Lab: 16 % — ABNORMAL HIGH (ref 11–15)
Lab: 17 (ref <20.7)
Lab: 2.4 K/UL (ref 1.0–4.8)
Lab: 22 % — ABNORMAL LOW (ref 24–44)
Lab: 282 K/UL (ref 150–400)
Lab: 31 pg (ref 26–34)
Lab: 33 g/dL (ref 32.0–36.0)
Lab: 4 % (ref 4–12)
Lab: 4.9 M/UL (ref 4.4–5.5)
Lab: 46 % (ref 40–50)
Lab: 6.6 FL — ABNORMAL LOW (ref 7–11)
Lab: 74 % (ref 41–77)
Lab: 94 FL (ref 80–100)

## 2020-12-31 LAB — POC GLUCOSE
Lab: 113 mg/dL — ABNORMAL HIGH (ref 70–100)
Lab: 74 mg/dL (ref 70–100)
Lab: 79 mg/dL (ref 70–100)

## 2020-12-31 LAB — BNP POC ER: Lab: 16 pg/mL (ref 0–100)

## 2020-12-31 LAB — POC TROPONIN: Lab: 0 ng/mL — ABNORMAL HIGH (ref 0.00–0.05)

## 2020-12-31 MED ORDER — LACTATED RINGERS IV SOLP
1000 mL | Freq: Once | INTRAVENOUS | 0 refills | Status: CP
Start: 2020-12-31 — End: ?
  Administered 2021-01-01: 04:00:00 1000 mL via INTRAVENOUS

## 2020-12-31 NOTE — ED Notes
PT FOUND SLEEPING ON HC2 BY [facility].  PT BROUGHT TO ED D/T PT BEING INTOXICATED.

## 2020-12-31 NOTE — ED Notes
DR PARK TO TRIAGE TO ASSESS PATIENT

## 2020-12-31 NOTE — ED Notes
PT AMBULATED OUT OF DEPT C A STEADY GAIT C [facility], PT OKAY TO LEAVE PER DR PARK

## 2020-12-31 NOTE — ED Notes
Discharge instructions reviewed with patient, all questions and concerned answered. Pt is A&Ox4, VSS, and ambulated out with a steady gait.

## 2020-12-31 NOTE — ED Triage Notes
Pt attempting to gain access to triage booth while T2 is with another patient. Pt educated on fall risk status and need to remain seated.  Pt returned to seat.

## 2020-12-31 NOTE — Progress Notes
Handoff Progress Note:     Patient care was transferred to myself from Dr. Shirline Frees.     Patient was being evaluated for alcohol intoxication.     Workup and disposition was pending alcohol level, which was 334 at 1650. Patient has been accepted to [facility] for outpatient alcohol detox.      His vitals remained stable and he is clinically sober. He is agreeable to plan for discharge to outpatient detox.     Patient care and plan discussed with Dr. Redmond Pulling.        DISPOSITION: Discharge     Nicanor Bake, DO  Emergency Medicine, PGY-1  [place] [phone]

## 2020-12-31 NOTE — ED Notes
Patient arrived to the ED via EMS.  Patient found lying in the middle of the street, responsive to painful stimuli.  Heavy odor of etoh with history of being seen at this ED for etoh intoxication 3 times today.  No signs of trauma noted.  Pupils are PERLA.  Respiration of 20-24 per min with clear lung sounds, no retractions or accessory muscle use.  Blood glucose of 74.    Medical History:   Diagnosis Date    Alcoholism ([place])     Depression     Herniation of lumbar intervertebral disc     Nephrolithiasis     Pancreatitis     PTSD (post-traumatic stress disorder)     Seizure ([place])        Belongings, shirt, sweatshirt, jeans, jacket, wallet, phone.

## 2020-12-31 NOTE — ED Notes
Pt is assumed to have left ED without discharge paperwork. No belongings were left in room.

## 2020-12-31 NOTE — ED Provider Notes
Daniel French is a 50 y.o. male.    Chief Complaint:  Chief Complaint   Patient presents with   ? Alcohol Problem       History of Present Illness:  50 year old male with history of alcoholism, depression, presented to the ER today with acute alcohol intoxication.  Patient reports that he drank approximately 6 pints of fireball whiskey last night.  Patient requesting to detox at this time.  No additional complaints at the moment.  No reported traumas or falls.  No suicidal or homicidal ideations.  Of note patient was seen yesterday evening for similar complaint.  Patient was discharged [facility] with a cab voucher.  Patient reportedly left the waiting room and did not go to [facility].          Review of Systems:  Review of Systems   Reason unable to perform ROS: Acute alcohol intoxication.       Allergies:  Patient has no known allergies.    Past Medical History:  Medical History:   Diagnosis Date   ? Alcoholism (HCC)    ? Depression    ? Herniation of lumbar intervertebral disc    ? Nephrolithiasis    ? Pancreatitis    ? PTSD (post-traumatic stress disorder)    ? Seizure [facility])        Past Surgical History:  Surgical History:   Procedure Laterality Date   ? HX APPENDECTOMY     ? KIDNEY STONE SURGERY         Pertinent medical/surgical history reviewed    Social History:  Social History     Tobacco Use   ? Smoking status: Never Smoker   ? Smokeless tobacco: Current User     Types: Chew   Vaping Use   ? Vaping Use: Never used   Substance Use Topics   ? Alcohol use: Yes     Alcohol/week: 0.0 standard drinks   ? Drug use: Yes     Types: Methamphetamines, Cocaine, Heroin     Comment: pills, a baggie of some brown stuff.     Social History     Substance and Sexual Activity   Drug Use Yes   ? Types: Methamphetamines, Cocaine, Heroin    Comment: pills, a baggie of some brown stuff.             Family History:  Family History   Problem Relation Age of Onset   ? Heart Attack Other    ? Hypertension Other    ? None Reported Mother    ? Hypertension Father    ? Cancer Father    ? None Reported Sister    ? None Reported Brother    ? None Reported Sister    ? None Reported Brother        Vitals:  ED Vitals    Date and Time T BP P RR SPO2P SPO2 User   12/31/20 1200 -- 113/86 -- -- 58 99 % MP   12/31/20 1100 -- 126/85 -- -- 68 97 % MP   12/31/20 1030 -- 120/78 -- -- 68 97 % MP   12/31/20 1000 -- 126/88 -- -- 73 98 % MP   12/31/20 0930 -- 113/82 -- -- 59 98 % MP   12/31/20 0900 -- 129/89 -- -- 64 98 % MP   12/31/20 0830 -- 147/100 -- -- 86 95 % MP   12/31/20 0815 36.8 ?C (98.2 ?F) 131/95 59 20 PER MINUTE -- -- MP  12/31/20 0800 -- 131/95 -- -- 59 97 % MP   12/31/20 0736 36.8 ?C (98.2 ?F) 144/96 -- 16 PER MINUTE 58 98 % BF          Physical Exam:  Physical Exam  Vitals and nursing note reviewed.   Constitutional:       Comments: Intoxicated   HENT:      Head: Normocephalic and atraumatic.      Right Ear: External ear normal.      Left Ear: External ear normal.      Nose: Nose normal.      Mouth/Throat:      Mouth: Mucous membranes are moist.      Pharynx: Oropharynx is clear.   Eyes:      Extraocular Movements: Extraocular movements intact.      Pupils: Pupils are equal, round, and reactive to light.   Cardiovascular:      Rate and Rhythm: Normal rate and regular rhythm.      Pulses: Normal pulses.   Pulmonary:      Effort: Pulmonary effort is normal.   Abdominal:      General: Abdomen is flat. There is no distension.      Palpations: Abdomen is soft.      Tenderness: There is no abdominal tenderness. There is no guarding or rebound.   Musculoskeletal:         General: Normal range of motion.      Cervical back: Normal range of motion.   Skin:     General: Skin is warm and dry.      Capillary Refill: Capillary refill takes less than 2 seconds.   Neurological:      Comments: Slurred speech.  Full neurologic assessment limited due to patient's acute alcohol intoxication.  Grossly moving upper and lower extremities symmetrically. Laboratory Results:  Labs Reviewed   POC GLUCOSE - Abnormal       Result Value Ref Range Status    Glucose, POC 113 (*) 70 - 100 MG/DL Final   POC GLUCOSE     POC Glucose (Download): (!) 113    Radiology Interpretation:    No orders to display         EKG:    ECG obtained 820, heart rate 55, sinus rhythm, PR interval 215, QTc 393, no STEMI.    ED Course:    50 year old male with history of alcoholism, depression, presented to the ER today with acute alcohol intoxication  Initial vitals reviewed noted above.  Patient evaluated by resident and attending physician.    +Slurred, sluggish behavior.  Reported EtOH intoxication.  Airway maintained.  Unlikely intracranial bleed, opioid intoxication or coingestion, sepsis, hypothyroidism.  Suspect likely transient course of intoxication with expected  improvement of symptoms as patient metabolizes offending agent.    No evidence of withdrawal on initial evaluation.  Patient is not tachycardic, diaphoretic, or tremulous.  Workup: POCT glucose, ECG, frequent reassessments    Reassessment patient awake, fully oriented, and appears to be clinically sober.  Able tolerate p.o. without any difficulty.  Able to ambulate with steady gait.    Prior to obtaining discharge paperwork patient eloped from the ER.           ED Scoring:                             MDM  Reviewed: previous chart, nursing note and vitals  Interpretation: labs and ECG  Facility Administered Meds:  Medications - No data to display      Clinical Impression:  Clinical Impression   Acute alcoholic intoxication in alcoholism without complication [facility])       Disposition/Follow up  ED Disposition     ED Disposition    Discharge        No follow-up provider specified.    Medications:  Discharge Medication List as of 12/31/2020 12:54 PM          Procedure Notes:  Procedures        Attestation / Supervision:        Gae Bon, DO

## 2021-01-01 ENCOUNTER — Encounter: Admit: 2021-01-01 | Discharge: 2021-01-01 | Primary: Family

## 2021-01-01 ENCOUNTER — Inpatient Hospital Stay: Admit: 2021-01-01

## 2021-01-01 ENCOUNTER — Emergency Department: Admit: 2021-01-01 | Discharge: 2021-01-01

## 2021-01-01 DIAGNOSIS — K859 Acute pancreatitis without necrosis or infection, unspecified: Secondary | ICD-10-CM

## 2021-01-01 DIAGNOSIS — F1020 Alcohol dependence, uncomplicated: Secondary | ICD-10-CM

## 2021-01-01 DIAGNOSIS — F10121 Alcohol abuse with intoxication delirium: Secondary | ICD-10-CM

## 2021-01-01 DIAGNOSIS — N200 Calculus of kidney: Secondary | ICD-10-CM

## 2021-01-01 DIAGNOSIS — F4310 Post-traumatic stress disorder, unspecified: Secondary | ICD-10-CM

## 2021-01-01 DIAGNOSIS — F32A Depression: Secondary | ICD-10-CM

## 2021-01-01 DIAGNOSIS — E876 Hypokalemia: Secondary | ICD-10-CM

## 2021-01-01 DIAGNOSIS — R569 Unspecified convulsions: Secondary | ICD-10-CM

## 2021-01-01 DIAGNOSIS — E872 Acidosis: Secondary | ICD-10-CM

## 2021-01-01 DIAGNOSIS — M5126 Other intervertebral disc displacement, lumbar region: Secondary | ICD-10-CM

## 2021-01-01 DIAGNOSIS — T510X1A Toxic effect of ethanol, accidental (unintentional), initial encounter: Secondary | ICD-10-CM

## 2021-01-01 LAB — COMPREHENSIVE METABOLIC PANEL
Lab: 0.5 mg/dL (ref 0.4–1.24)
Lab: 115 MMOL/L — ABNORMAL HIGH (ref 98–110)
Lab: 149 MMOL/L — ABNORMAL HIGH (ref 137–147)
Lab: 2.8 MMOL/L — ABNORMAL LOW (ref 3.5–5.1)

## 2021-01-01 LAB — URINALYSIS MICROSCOPIC REFLEX TO CULTURE

## 2021-01-01 LAB — MAGNESIUM
Lab: 1.4 mg/dL — ABNORMAL LOW (ref 1.6–2.6)
Lab: 1.8 mg/dL (ref 1.6–2.6)

## 2021-01-01 LAB — ALCOHOL LEVEL: Lab: 587 mg/dL

## 2021-01-01 LAB — COVID-19 (SARS-COV-2) PCR

## 2021-01-01 LAB — LACTIC ACID (BG - RAPID LACTATE)
Lab: 4.5 MMOL/L — ABNORMAL HIGH (ref 0.5–2.0)
Lab: 3.3 MMOL/L — ABNORMAL HIGH (ref 0.5–2.0)
Lab: 3.6 MMOL/L — ABNORMAL HIGH (ref 0.5–2.0)
Lab: 3.1 MMOL/L — ABNORMAL HIGH (ref 0.5–2.0)

## 2021-01-01 LAB — URINALYSIS DIPSTICK REFLEX TO CULTURE
Lab: NEGATIVE
Lab: NEGATIVE
Lab: NEGATIVE
Lab: NEGATIVE
Lab: NEGATIVE
Lab: NEGATIVE

## 2021-01-01 LAB — AMPHETAMINES-URINE RANDOM: Lab: NEGATIVE

## 2021-01-01 LAB — POC BLOOD GAS VEN
Lab: 2 MMOL/L
Lab: 23 MMOL/L
Lab: 36 mmHg (ref 36–50)
Lab: 69 mmHg — ABNORMAL HIGH (ref 33–48)
Lab: 7.4 — ABNORMAL HIGH (ref 7.30–7.40)
Lab: 94 % — ABNORMAL HIGH (ref 55–71)

## 2021-01-01 LAB — CBC AND DIFF: Lab: 11 K/UL — ABNORMAL HIGH (ref 4.5–11.0)

## 2021-01-01 LAB — BASIC METABOLIC PANEL
Lab: 0.7 mg/dL (ref 0.4–1.24)
Lab: 100 mg/dL (ref 70–100)
Lab: 112 MMOL/L — ABNORMAL HIGH (ref 98–110)
Lab: 12 mg/dL (ref 7–25)
Lab: 148 MMOL/L — ABNORMAL HIGH (ref 137–147)
Lab: 16 — ABNORMAL HIGH (ref 3–12)
Lab: 20 MMOL/L — ABNORMAL LOW (ref 21–30)
Lab: 3.8 MMOL/L (ref 3.5–5.1)
Lab: 7.3 mg/dL — ABNORMAL LOW (ref 8.5–10.6)
Lab: >60 mL/min (ref >60)

## 2021-01-01 LAB — POC SODIUM: Lab: 145 MMOL/L (ref 137–147)

## 2021-01-01 LAB — PROCALCITONIN: Lab: 1.6 ng/mL

## 2021-01-01 LAB — CANNABINOIDS-URINE RANDOM: Lab: NEGATIVE

## 2021-01-01 LAB — BLOOD GASES, ARTERIAL
Lab: 170 mmHg — ABNORMAL HIGH (ref 80–100)
Lab: 21 MMOL/L (ref 21–28)
Lab: 3.4 MMOL/L
Lab: 7.3 — ABNORMAL LOW (ref 7.35–7.45)
Lab: 98 % (ref 95–99)

## 2021-01-01 LAB — POC POTASSIUM: Lab: 3.2 MMOL/L — ABNORMAL LOW (ref 3.5–5.1)

## 2021-01-01 LAB — BETA HYDROXYBUTYRATE (KETONES): Lab: 0.1 MMOL/L (ref <0.3)

## 2021-01-01 LAB — OPIATES-URINE RANDOM: Lab: NEGATIVE

## 2021-01-01 LAB — POC BLOOD GAS ARTERIAL
Lab: 23 MMOL/L (ref 21–28)
Lab: 39 mmHg (ref 35–45)
Lab: 7.3 (ref 7.35–7.45)

## 2021-01-01 LAB — OSMOLALITY-URINE RANDOM: Lab: 729 mosm/kg (ref 50–1400)

## 2021-01-01 LAB — PHOSPHORUS: Lab: 3.1 mg/dL (ref 2.0–4.5)

## 2021-01-01 LAB — OSMOLALITY: Lab: 438 mosm/kg — ABNORMAL HIGH (ref 280–307)

## 2021-01-01 LAB — PHENCYCLIDINES-URINE RANDOM: Lab: NEGATIVE

## 2021-01-01 LAB — IONIZED CALCIUM: Lab: 1 MMOL/L (ref 1.0–1.3)

## 2021-01-01 LAB — COCAINE-URINE RANDOM: Lab: NEGATIVE

## 2021-01-01 LAB — POC GLUCOSE: Lab: 116 mg/dL — ABNORMAL HIGH (ref 70–100)

## 2021-01-01 LAB — POC HEMATOCRIT
Lab: 13 g/dL — ABNORMAL HIGH (ref 13.5–16.5)
Lab: 40 % (ref 40–50)

## 2021-01-01 LAB — BARBITURATES-URINE RANDOM: Lab: POSITIVE — AB

## 2021-01-01 LAB — TROPONIN-I: Lab: 0 ng/mL (ref 0.0–0.05)

## 2021-01-01 LAB — BENZODIAZEPINES-URINE RANDOM: Lab: POSITIVE — AB

## 2021-01-01 MED ORDER — FAMOTIDINE (PF) 20 MG/2 ML IV SOLN
20 mg | Freq: Two times a day (BID) | INTRAVENOUS | 0 refills | Status: DC
Start: 2021-01-01 — End: 2021-01-01
  Administered 2021-01-01: 18:00:00 20 mg via INTRAVENOUS

## 2021-01-01 MED ORDER — LACTATED RINGERS IV SOLP
1000 mL | Freq: Once | INTRAVENOUS | 0 refills | Status: CP
Start: 2021-01-01 — End: ?
  Administered 2021-01-01: 16:00:00 1000 mL via INTRAVENOUS

## 2021-01-01 MED ORDER — POTASSIUM CHLORIDE 20 MEQ/15 ML PO LIQD
40-60 meq | NASOGASTRIC | 0 refills | Status: DC | PRN
Start: 2021-01-01 — End: 2021-01-01
  Administered 2021-01-01: 18:00:00 40 meq via NASOGASTRIC

## 2021-01-01 MED ORDER — FENTANYL CITRATE (PF) 50 MCG/ML IJ SOLN
100 ug | Freq: Once | INTRAVENOUS | 0 refills | Status: CP
Start: 2021-01-01 — End: ?

## 2021-01-01 MED ORDER — THIAMINE MONONITRATE (VIT B1) 100 MG PO TAB
100 mg | Freq: Every day | ORAL | 0 refills | Status: DC
Start: 2021-01-01 — End: 2021-01-01

## 2021-01-01 MED ORDER — FOLIC ACID 1 MG PO TAB
1 mg | Freq: Every day | ORAL | 0 refills | Status: AC
Start: 2021-01-01 — End: ?
  Administered 2021-01-01 – 2021-01-07 (×7): 1 mg via ORAL

## 2021-01-01 MED ORDER — MAGNESIUM SULFATE IN D5W 1 GRAM/100 ML IV PGBK
1 g | INTRAVENOUS | 0 refills | Status: CP
Start: 2021-01-01 — End: ?
  Administered 2021-01-02: 02:00:00 1 g via INTRAVENOUS

## 2021-01-01 MED ORDER — MAGNESIUM SULFATE IN D5W 1 GRAM/100 ML IV PGBK
1 g | INTRAVENOUS | 0 refills | Status: AC
Start: 2021-01-01 — End: ?
  Administered 2021-01-02: 06:00:00 1 g via INTRAVENOUS

## 2021-01-01 MED ORDER — POTASSIUM CHLORIDE IN WATER 10 MEQ/50 ML IV PGBK
10 meq | INTRAVENOUS | 0 refills | Status: DC | PRN
Start: 2021-01-01 — End: 2021-01-01
  Administered 2021-01-01 (×2): 10 meq via INTRAVENOUS

## 2021-01-01 MED ORDER — THIAMINE/FOLIC ACID IVPB
Freq: Every day | INTRAVENOUS | 0 refills | Status: DC
Start: 2021-01-01 — End: 2021-01-01
  Administered 2021-01-01 (×3): 50.000 mL via INTRAVENOUS

## 2021-01-01 MED ORDER — FAMOTIDINE 20 MG PO TAB
20 mg | Freq: Two times a day (BID) | ORAL | 0 refills | Status: AC
Start: 2021-01-01 — End: ?
  Administered 2021-01-02 – 2021-01-03 (×4): 20 mg via ORAL

## 2021-01-01 MED ORDER — LACTATED RINGERS IV SOLP
1000 mL | Freq: Once | INTRAVENOUS | 0 refills | Status: CP
Start: 2021-01-01 — End: ?

## 2021-01-01 MED ORDER — MAGNESIUM SULFATE IN D5W 1 GRAM/100 ML IV PGBK
1 g | INTRAVENOUS | 0 refills | Status: DC | PRN
Start: 2021-01-01 — End: 2021-01-01
  Administered 2021-01-01: 19:00:00 1 g via INTRAVENOUS

## 2021-01-01 MED ORDER — DEXTROSE 5%-0.45% SODIUM CHLORIDE IV SOLP
1000 mL | Freq: Once | INTRAVENOUS | 0 refills | Status: AC
Start: 2021-01-01 — End: ?
  Administered 2021-01-02: 07:00:00 1000 mL via INTRAVENOUS

## 2021-01-01 MED ORDER — CALCIUM GLUCONATE 1G/100ML IVPB (MB+)
1 g | INTRAVENOUS | 0 refills | Status: DC | PRN
Start: 2021-01-01 — End: 2021-01-01

## 2021-01-01 MED ORDER — ROCURONIUM 10 MG/ML IV SOLN
100 mg | Freq: Once | INTRAVENOUS | 0 refills | Status: CP
Start: 2021-01-01 — End: ?
  Administered 2021-01-01: 16:00:00 100 mg via INTRAVENOUS

## 2021-01-01 MED ORDER — PROPOFOL 10 MG/ML IV EMUL
5-30 ug/kg/min | INTRAVENOUS | 0 refills | Status: AC
Start: 2021-01-01 — End: ?
  Administered 2021-01-01 – 2021-01-02 (×3): 20 ug/kg/min via INTRAVENOUS
  Administered 2021-01-02: 15:00:00 50 ug/kg/min via INTRAVENOUS
  Administered 2021-01-02: 11:00:00 30 ug/kg/min via INTRAVENOUS
  Administered 2021-01-03 (×2): 20 ug/kg/min via INTRAVENOUS

## 2021-01-01 MED ORDER — POTASSIUM CHLORIDE 20 MEQ PO TBTQ
40-60 meq | ORAL | 0 refills | Status: DC | PRN
Start: 2021-01-01 — End: 2021-01-01

## 2021-01-01 MED ORDER — ENOXAPARIN 40 MG/0.4 ML SC SYRG
40 mg | Freq: Every day | SUBCUTANEOUS | 0 refills | Status: AC
Start: 2021-01-01 — End: ?
  Administered 2021-01-02 – 2021-01-07 (×6): 40 mg via SUBCUTANEOUS

## 2021-01-01 MED ORDER — THIAMINE 100MG IVPB
500 mg | Freq: Three times a day (TID) | INTRAVENOUS | 0 refills | Status: AC
Start: 2021-01-01 — End: ?
  Administered 2021-01-02 (×4): 500 mg via INTRAVENOUS

## 2021-01-01 MED ORDER — ETOMIDATE 2 MG/ML IV SOLN
10 mg | Freq: Once | INTRAVENOUS | 0 refills | Status: CP
Start: 2021-01-01 — End: ?
  Administered 2021-01-01: 16:00:00 10 mg via INTRAVENOUS

## 2021-01-01 MED ORDER — LACTATED RINGERS IV SOLP
1000 mL | INTRAVENOUS | 0 refills | Status: CP
Start: 2021-01-01 — End: ?
  Administered 2021-01-02: 04:00:00 1000 mL via INTRAVENOUS

## 2021-01-01 MED ORDER — THIAMINE 100MG IVPB
500 mg | Freq: Three times a day (TID) | INTRAVENOUS | 0 refills | Status: DC
Start: 2021-01-01 — End: 2021-01-01

## 2021-01-01 MED ORDER — LACTATED RINGERS IV SOLP
Freq: Once | INTRAVENOUS | 0 refills | Status: CP
Start: 2021-01-01 — End: ?
  Administered 2021-01-01: 20:00:00 721 mL via INTRAVENOUS

## 2021-01-01 MED ORDER — FENTANYL DRIP IN NS 1000MCG/100ML
10-70 ug/h | INTRAVENOUS | 0 refills | Status: AC
Start: 2021-01-01 — End: ?
  Administered 2021-01-02: 02:00:00 25 ug/h via INTRAVENOUS
  Administered 2021-01-02 – 2021-01-03 (×2): 70 ug/h via INTRAVENOUS

## 2021-01-01 MED ORDER — DEXTROSE 5%-0.45% SODIUM CHLORIDE IV SOLP
1000 mL | Freq: Once | INTRAVENOUS | 0 refills | Status: CP
Start: 2021-01-01 — End: ?
  Administered 2021-01-01: 22:00:00 1000 mL via INTRAVENOUS

## 2021-01-01 MED ADMIN — LACTATED RINGERS IV SOLP [4318]: 1000 mL | INTRAVENOUS | @ 18:00:00 | Stop: 2021-01-01 | NDC 00338011704

## 2021-01-01 MED ADMIN — FENTANYL CITRATE (PF) 50 MCG/ML IJ SOLN [3037]: 100 ug | INTRAVENOUS | @ 17:00:00 | Stop: 2021-01-01 | NDC 00409909412

## 2021-01-01 MED ADMIN — PHENOBARBITAL SODIUM 130 MG/ML IJ SOLN [6221]: 260 mg | INTRAVENOUS | @ 13:00:00 | Stop: 2021-01-01 | NDC 42494041601

## 2021-01-01 NOTE — ED Notes
Pt belongings:    Jeans, boots x2, socks x2, hat, underwear, coat, phone, cap, wallet (DL, SSN), t-shirt, sweater

## 2021-01-01 NOTE — Progress Notes
Continuum of Care     Multiple Visit Patient - Case Management Note      NAME: Daniel French MRN: [id number] DOB: 1971-11-07  AGE: 50 y.o.    Admits in past 12 mos: ED Visits: 14 Admissions: 6     Source of Information: Pt, EMR, Family    Primary MVP Case Manager: Kandee Keen     Reporting Out: SW coordinated with ED team, Pt?s friend and parents, [facility] and attempted Probation officer of Utilization: Behavioral; substance use, mental health; Social; basic needs, social determinants of health    Disposition : Admit ? Inpatient     Interventions   ? SW coordinated with ED team on Pt presentation, multi visit patient status and next steps   ? SW spoke to Pt?s friend, [name], and Pt?s parents  re: current intubation, recurrent admissions for alcohol abuse   ? Father ? Daniel French [phone] Mother ? Daniel French ? [phone]   ? Pt has been to ED 5x in 48 hours for intoxication. BAL was 587 upon Inpatient admission.   ? Pt is uninsured, limiting his options for detox centers. This SW coordinated admission to [facility] on 12/30/20, Pt left while waiting on cab to [facility]. Pt is currently homeless. ED overnight SW coordinated Pt discharge to [facility] day shelter on 01/01/21, Pt left while waiting on cab. Pt returned by EMS both times after being found down on the street or on [place] campus.   ? Pt?s family reports that they attempted to connect him to [facility] sober living on Sunday 12/29/20, but Pt left after learning that it is a 1 year commitment. They report that he has been most successful at [facility] sober living for approximately 6 months but may not be able to return there. [facility] reported that Pt was there after 12/08/20 ED visit, and left prematurely from that admission stating he had a job to get to.   ? Pt?s family expressed concerns for Pt well-being and that he is a danger to himself. Their hopes are that Pt would be involuntarily committed to a psychiatric hospital for treatment. Pt?s mother discussed pattern of Pt leaving previous treatment programs to continue drinking and acknowledged the MH contributors to his circumstances and addiction.   ? SW contacted [employer] [phone] and LVM for clinical manager requesting return call to determine Pt?s eligibility to return  ? SW sent referral to [employer] / [employer] for [facility] application once Pt is able to participate in application process.     Next Steps  ? Psych consult to determine whether Pt meets criteria for involuntary admission and further assessment of MH needs / willingness to access services   ? Ongoing coordination with [place] [facility] re: Medicaid eligibility and completion of application Ongoing  ? Coordination with [name] or alternative sober living communities for potential return upon discharge         [name]  Cell Phone: [phone] or [name]

## 2021-01-01 NOTE — ED Notes
Pt is a 50 yo male brought to the ED by hospital security for alcohol intoxication. Pt was discharged from the ED this morning and was found laying on the ground in the hospital lobby by security. Pt wheeled to room in a wheelchair, had to be lifted into bed. Pt was unresponsive with snoring respirations. Dr. Ruthann Cancer at bedside for resuscitation.    Pt is stuperous, not withdrawing from painful stimuli, GCS 3, but maintaining airway despite snoring respirations, Dr. Ruthann Cancer okay with no airway adjuncts at this time. Rhonchi heard in lung fields. Attempted IV access 8 times without success. Pt placed on cardiac and ETCO2 monitors.

## 2021-01-01 NOTE — ED Notes
Pt straight cathed w Audelia Acton, tech present. 500 mL drained from pt bladder. Specimen sent to lab

## 2021-01-01 NOTE — H&P (View-Only)
Critical Care   Admission History and Physical Assessment         Name:  Daniel French                                             MRN:  [id number]   Admission Date:  01/01/2021  Principal Problem:    Acute alcohol intoxication delirium with mild use disorder (HCC)  Active Problems:    PTSD (post-traumatic stress disorder)    Anxiety    Normocytic anemia    Hypokalemia    Moderate malnutrition (HCC)                       Assessment and Plan     Daniel French is a 50 year old with PMH s/f alcohol use disorder, polysubstance abuse, HTN, HLD, normocytic anemia, PTSD, and anxiety who has been seen multiple time in the ED over the last several days for alcohol intoxication. After most recent ED discharge, patient was found hours later passed out in hospital lobby with GCS of 3. He was subsequently intubated and deemed appropriate for ICU admission.    NEURO  #Alcoholic intoxication  #Polysubstance abuse history  #Sedated on Mechanical Ventilation  #PTSD/Anxiety  - GCS 3 upon representation to ED  - Sedation with propofol/fentanyl  - Repeat Etoh 587  - CT head: no acute intracranial hemorrhage or calvarial fracture  - History of withdrawal and patient has previously reported DT  - ED UDS positive barbituates and benzos  - s/p IV thiamine and folate in ED  Plan  > Wean sedation as able   > IV thiamine 500mg  x3 doses then resume PTA PO 100mg  daily  > Continue PTA folic acid 1mg  daily  > f/u repeat UDS and ethylene glycol  > AWAS protocol once extubated  > Consider psych consult once medically stable  > Appreciate SW/CM team assistance with needs/dispo planning  > Daily triglyceride level while on propofol      PULM  #Mechanical Ventilation  - CXR: persistent limited depth of inspiration without consolidating pneumonia  - ABG in ED 7.38/39/227/23.3/100%  - COVID negative  Plan  > f/u repeat ABG      CV  #Hx HTN  #Hx HLD  - No current medication regimen  - Intermittent hypotention in ED, improved s/p IVF  - Trop and BNP unremarkable  Plan  > Monitor vitals    GI  #Hx melena  > BID famotidine while intubated      RENAL  #Hypokalemia  #Hypomagnesemia  #Lactic Acidosis  #High Anion gap acidosis, resolved  - K 2.8 in ED  - Mg 1.4 in ED  - Lactate 4.5 in ED  - anion gap 19 initially, improved to 12 in ED  - no osmolar gap  - s/p 3L IVF, K IV and PO, 1g Mag  Plan  - Will give 2 L IVF @ 168ml/hr  - q4hr lactate  > f/u repeat BMP, Mg, Phos      ENDO  - BHB 0.1  - Glucose 112 in ED  > Monitor BG with daily labs    ID  #Leukocytosis, mild  - WBC 11.6 in ED  - Likely reactive  Plan  > f/u UA and procal    HEME  #Hx normocytic anemia  - Hgb 12.2, ~ patient's baseline  >  Monitor with daily CBC    FEN  - Diet NPO  - IVF as above  - Replace electrolytes PRN    Prophylaxis Review:  Lines:  PIV x 3  Tubes/Drains: Endotracheal, OG, Foley  VTE PPX: Lovenox  GI ppx:  Famotidine  PT/OT: Ordered    Code status: Full code (discussed with father 02/02)  Dispo: Admit to Med-ICU    Patient seen and discussed with Dr. Donzetta Kohut, MD  Resident Physician PGY-2  Family Medicine Residency  [facility]  Team Pager [phone]  ______________________________________________________  Primary Care Physician: Lorenza Evangelist      HISTORY OF PRESENT ILLNESS: Daniel French is a 50 y.o. male with PMH s/f alcohol use disorder, polysubstance abuse, HTN, HLD, normocytic anemia, PTSD, and anxiety who has been seen multiple time in the ED over the last several days for alcohol intoxication.  After most recent ED discharge, patient was found hours later passed out in hospital lobby with GCS of 3. He was subsequently intubated. Alcohol level had increased to over 500 and UDS was positive for both barbiturates and benzos. Patient had multiple electrolyte abnormalities and elevated lactate.    Patient has previous hospital admission for alcohol withdrawal and at the time patient reported a history of DT with withdrawal. Patient is uninsured and has been to many sober living facilities over the last several years. See 02/02 case management note for further details.    PMH:  Medical History:   Diagnosis Date   ? Alcoholism (HCC)    ? Depression    ? Herniation of lumbar intervertebral disc    ? Nephrolithiasis    ? Pancreatitis    ? PTSD (post-traumatic stress disorder)    ? Seizure (HCC)         PSH:  Surgical History:   Procedure Laterality Date   ? HX APPENDECTOMY     ? KIDNEY STONE SURGERY          SOCIAL HISTORY:  Social History     Socioeconomic History   ? Marital status: Single     Spouse name: Not on file   ? Number of children: Not on file   ? Years of education: Not on file   ? Highest education level: Not on file   Occupational History   ? Not on file   Tobacco Use   ? Smoking status: Never Smoker   ? Smokeless tobacco: Current User     Types: Chew   Vaping Use   ? Vaping Use: Never used   Substance and Sexual Activity   ? Alcohol use: Yes     Alcohol/week: 0.0 standard drinks   ? Drug use: Yes     Types: Methamphetamines, Cocaine, Heroin     Comment: pills, a baggie of some brown stuff.   ? Sexual activity: Not on file   Other Topics Concern   ? Not on file   Social History Narrative    ** Merged History Encounter **             FAMILY HISTORY:  Family History   Problem Relation Age of Onset   ? Heart Attack Other    ? Hypertension Other    ? None Reported Mother    ? Hypertension Father    ? Cancer Father    ? None Reported Sister    ? None Reported Brother    ? None Reported Sister    ?  None Reported Brother         IMMUNIZATIONS:   Immunization History   Administered Date(s) Administered   ? COVID-19 (MODERNA), mRNA vacc, 100 mcg/0.5 mL (PF) 06/23/2020, 09/09/2020   ? Flu Vaccine =>6 Months Quadrivalent PF 09/27/2018   ? Tdap Vaccine 05/31/2016, 10/06/2020           ALLERGIES:  Patient has no known allergies.    HOME MEDICATIONS:  Medications Prior to Admission   Medication Sig   ? cloNIDine HCL (CATAPRES) 0.1 mg tablet Take 0.1 mg by mouth three times daily.   ? dextroamphetamine-amphetamine (ADDERALL) 10 mg tablet Take 10 mg by mouth at bedtime as needed   ? dextroamphetamine-amphetamine (ADDERALL) 30 mg tablet Take 30 mg by mouth twice daily   ? FLUoxetine (PROZAC) 20 mg capsule Take 20 mg by mouth at bedtime daily.   ? folic acid (FOLVITE) 1 mg tablet Take one tablet by mouth daily.   ? gabapentin (NEURONTIN) 300 mg capsule Take 3 capsules every 8 hours for 2 doses on 1/2 and then take 2 capsules every 8 hours for 3 days and then take 1 capsule every 8 hours for 3 days. (Patient taking differently: Take 300 mg by mouth three times daily.)   ? QUEtiapine (SEROQUEL) 200 mg tablet Take 200 mg by mouth at bedtime daily.   ? thiamine (VITAMIN B-1) 100 mg tablet Take one tablet by mouth daily.            Review of Systems  Unable to obtain, sedated      Physical Exam  General Appearance: Sedated, dressed in hospital attire  Skin: few small cuts/scabs diffusely  HEENT: MMM, no JVD  Chest and Lungs: Mechanical ventilation sounds audible in anterior lung fields  Heart: RRR, no murmurs  Abdomen: soft, nondistended, bowel sounds present  Genitourinary: deferred  Extremities: No cyanosis or edema, palpable pulses, dorsal aspect of L foot slightly less warm than R foot, IV intact overlying L foot  Neurologic: Paralyzed    Laboratory:    Recent Labs     12/30/20  1012 01/01/21  0421 01/01/21  1022   NA 148* 147 149*   K 3.6 4.1 2.8*   CL 110 109 115*   CO2 25 19* 22   GAP 13* 19* 12   BUN 8 13 12    CR 0.77 0.75 0.51   GLU 171* 125* 112*   CA 8.7 7.9* 6.4*   ALBUMIN 4.5 4.1 3.2*   MG  --  1.8 1.4*       Recent Labs     12/30/20  1012 12/31/20  2227 12/31/20  2349 01/01/21  0202 01/01/21  0421 01/01/21  1022   WBC 4.1* 11.2*  --   --   --  11.6*   HGB 12.8* 15.7  --   --   --  12.2*   HCT 38.0* 46.8  --   --   --  36.3*   PLTCT 303 282  --   --   --  210   AST 29  --   --   --  30 19   ALT 45  --   --   --  30 22 ALKPHOS 86  --   --   --  77 56   TNI  --   --  0.01 0.01  --   --       Estimated Creatinine Clearance: 163.8 mL/min (based on SCr of 0.51 mg/dL).  Vitals:    01/01/21 0921   Weight: 90.7 kg (200 lb)    No results for input(s): PHART, PO2ART in the last 72 hours.    Invalid input(s): PC02A                      Vital Signs: Last Filed                  Vital Signs: 24 Hour Range   BP: 115/81 (02/02 1600)  Temp: 36.9 ?C (98.5 ?F) (02/02 1530)  Pulse: 70 (02/02 1600)  Respirations: 16 PER MINUTE (02/02 1600)  SpO2: 100 % (02/02 1600)  SpO2 Pulse: 70 (02/02 1600) BP: (79-148)/(49-101)   Temp:  [36.4 ?C (97.6 ?F)-36.9 ?C (98.5 ?F)]   Pulse:  [48-97]   Respirations:  [13 PER MINUTE-28 PER MINUTE]   SpO2:  [88 %-100 %]      Vitals:    01/01/21 0921   Weight: 90.7 kg (200 lb)             Radiology and Other Diagnostic Procedures Review:    Reviewed

## 2021-01-01 NOTE — ED Provider Notes
Daniel French is a 50 y.o. male.    Chief Complaint:  Chief Complaint   Patient presents with   ? Alcohol intoxication       History of Present Illness:  Daniel French is a 50 y.o. male, with a history of seizure, pancreatitis, depression and PTSD who presents to the emergency department for altered mental status. Patient seen in this ED 3 times in the past 24 hours for alcohol intoxication. Per report; patient was found this time laying in the lobby by security. Patient is currently unable to provide any history.      History provided by:  Medical records  History limited by:  Mental status change  Language interpreter used: No        Review of Systems:  Review of Systems   Unable to perform ROS: Mental status change       Allergies:  Patient has no known allergies.    Past Medical History:  Medical History:   Diagnosis Date   ? Alcoholism (HCC)    ? Depression    ? Herniation of lumbar intervertebral disc    ? Nephrolithiasis    ? Pancreatitis    ? PTSD (post-traumatic stress disorder)    ? Seizure [facility])        Past Surgical History:  Surgical History:   Procedure Laterality Date   ? HX APPENDECTOMY     ? KIDNEY STONE SURGERY         Pertinent medical/surgical history reviewed  Medical History:   Diagnosis Date   ? Alcoholism (HCC)    ? Depression    ? Herniation of lumbar intervertebral disc    ? Nephrolithiasis    ? Pancreatitis    ? PTSD (post-traumatic stress disorder)    ? Seizure [facility])      Surgical History:   Procedure Laterality Date   ? HX APPENDECTOMY     ? KIDNEY STONE SURGERY         Social History:  Social History     Tobacco Use   ? Smoking status: Never Smoker   ? Smokeless tobacco: Current User     Types: Chew   Vaping Use   ? Vaping Use: Never used   Substance Use Topics   ? Alcohol use: Yes     Alcohol/week: 0.0 standard drinks   ? Drug use: Yes     Types: Methamphetamines, Cocaine, Heroin     Comment: pills, a baggie of some brown stuff.     Social History     Substance and Sexual Activity   Drug Use Yes   ? Types: Methamphetamines, Cocaine, Heroin    Comment: pills, a baggie of some brown stuff.             Family History:  Family History   Problem Relation Age of Onset   ? Heart Attack Other    ? Hypertension Other    ? None Reported Mother    ? Hypertension Father    ? Cancer Father    ? None Reported Sister    ? None Reported Brother    ? None Reported Sister    ? None Reported Brother        Vitals:  ED Vitals    Date and Time T BP P RR SPO2P SPO2 User   01/01/21 1045 -- 127/90 79 16 PER MINUTE 79 100 % AS   01/01/21 1030 -- 143/101 -- -- -- -- AS  01/01/21 1021 -- -- 82 14 PER MINUTE -- 100 % WM   01/01/21 1015 -- -- 84 16 PER MINUTE 84 100 % AS   01/01/21 1008 -- -- 93 20 PER MINUTE 93 100 % AS   01/01/21 1007 -- -- 95 19 PER MINUTE 95 100 % AS   01/01/21 1006 -- 130/95 97 25 PER MINUTE 97 100 % AS   01/01/21 1005 -- -- 89 20 PER MINUTE 89 100 % AS   01/01/21 1004 -- 121/86 86 28 PER MINUTE 76 98 % AS   01/01/21 1003 -- 134/100 83 24 PER MINUTE 83 96 % AS   01/01/21 1002 -- -- 82 17 PER MINUTE 80 97 % AS   01/01/21 1001 -- -- 84 24 PER MINUTE 84 92 % AS   01/01/21 1000 -- 134/96 78 23 PER MINUTE 78 94 % AS   01/01/21 0945 -- -- 88 22 PER MINUTE 88 94 % AS   01/01/21 0930 -- 140/99 85 19 PER MINUTE 85 94 % AS   01/01/21 0927 -- -- 87 19 PER MINUTE 87 90 % AS   01/01/21 0925 -- -- 87 19 PER MINUTE 87 88 % AS   01/01/21 0921 36.5 ?C (97.7 ?F) 138/100 89 21 PER MINUTE 89 94 % AS          Physical Exam:  Physical Exam  Vitals and nursing note reviewed.         Laboratory Results:  Labs Reviewed   POC GLUCOSE - Abnormal       Result Value Ref Range Status    Glucose, POC 116 (*) 70 - 100 MG/DL Final   POC BLOOD GAS VEN - Abnormal    PH-VEN-POC 7.42 (*) 7.30 - 7.40 Final    PCO2-VEN-POC 36  36 - 50 MMHG Final    PO2-VEN-POC 69 (*) 33 - 48 MMHG Final    Base Def-VEN-POC 2.0  MMOL/L Final    O2 Sat-VEN-POC 94.0 (*) 55 - 71 % Final    Bicarbonate-VEN-POC 23.0  MMOL/L Final   POC BLOOD GAS ARTERIAL - Abnormal    PH-ART-POC 7.38  7.35 - 7.45 Final    PCO2-ART-POC 39  35 - 45 MMHG Final    PO2-ART-POC 227 (*) 80 - 100 MMHG Final    Base Def-ART-POC 2.0  MMOL/L Final    O2 Sat-ART-POC 100.0 (*) 95 - 99 % Final    Bicarbonate-ART-POC 23.3  21 - 28 MMOL/L Final   POC POTASSIUM - Abnormal    Potassium-POC 3.2 (*) 3.5 - 5.1 MMOL/L Final   POC HEMATOCRIT    Hemoglobin POC 13.6  13.5 - 16.5 GM/DL Final    Hematocrit POC 40.0  40 - 50 % Final   POC SODIUM    Sodium-POC 145  137 - 147 MMOL/L Final   CBC AND DIFF    White Blood Cells    4.5 - 11.0 K/UL Incomplete    RBC    4.4 - 5.5 M/UL Incomplete    Hemoglobin    13.5 - 16.5 GM/DL Incomplete    Hematocrit    40 - 50 % Incomplete    MCV    80 - 100 FL Incomplete    MCH    26 - 34 PG Incomplete    MCHC    32.0 - 36.0 G/DL Incomplete    RDW    11 - 15 % Incomplete    Platelet Count    150 -  400 K/UL Incomplete    MPV    7 - 11 FL Incomplete   COMPREHENSIVE METABOLIC PANEL   ALCOHOL LEVEL     POC Glucose (Download): (!) 116    Radiology Interpretation:    CT HEAD WO CONTRAST   Preliminary Result         Head:       No acute intracranial hemorrhage or calvarial fracture.      Cervical Spine:       1.  No evidence of acute cervical fracture or traumatic subluxation.   2.  Congenital C5-C6 ankylosis.   3.  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                    Approved by Rene Paci, M.D. on 01/01/2021 10:56 AM         CT SPINE CERVICAL WO CONTRAST   Preliminary Result         Head:       No acute intracranial hemorrhage or calvarial fracture.      Cervical Spine:       1.  No evidence of acute cervical fracture or traumatic subluxation.   2.  Congenital C5-C6 ankylosis.   3.  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                    Approved by Rene Paci, M.D. on 01/01/2021 10:56 AM         CHEST SINGLE VIEW   Final Result         1. Interval placement of endotracheal tube and gastric tube, as noted above.      2. Persistent limited depth of inspiration without consolidating pneumonia.          Finalized by Particia Jasper, M.D. on 01/01/2021 10:37 AM. Dictated by Particia Jasper, M.D. on 01/01/2021 10:35 AM.                 ED Course:  50 y.o. M who presents with AMS. This patient is well known to our ED for recurrent presentations for severe alcohol intoxication. He was found sleeping in the hospital lobby an hour after discharge from ED this morning. This is his 5th visit in ~48 hours. Initially, patient was completely obtunded, sonorous respirations, but gag was intact. He gradually developed sonorous respirations, lost his gag, and was requiring supplemental oxygen with an apneic period. Intubated for airway protection, head and spine CT obtained to rule out occult injury from fall.       ED Scoring:                             MDM  Reviewed: nursing note, vitals and previous chart  Reviewed previous: labs  Interpretation: labs        Facility Administered Meds:  Medications   propofol (DIPRIVAN) 10 mg/mL IV drip (30 mcg/kg/min ? 90.7 kg Intravenous Dose/Rate Change 01/01/21 1035)   fentaNYL (SUBLIMAZE)  1000 mcg/100 mL NS IV drip (std conc)(premade) (has no administration in time range)   etomidate (AMIDATE) injection 10 mg (10 mg Intravenous Given 01/01/21 1003)   rocuronium injection 100 mg (100 mg Intravenous Given 01/01/21 1003)   lactated ringers infusion (0 mL Intravenous Infusion Stopped 01/01/21 1052)         Clinical  Impression:  Clinical Impression   None       Disposition/Follow up  ED Disposition     None        No follow-up provider specified.    Medications:  New Prescriptions    No medications on file       Procedure Notes:    Procedure: Airway Placement    Intubation    Date/Time: 01/01/2021 9:03 AM    Patient location: ED  Urgency: emergent  Difficult Airway: No        The procedure was performed in an emergent situation.  Verbal consent not obtained.  Written consent not obtained.  Risks and benefits discussed: no  Patient identity confirmation: arm band    Airway Procedure  Indication(s) for airway management: airway protection    Sedation medication: etomidate  Paralytics given: rocuronium  rapid sequence induction  Level of sedation achieved: Unconscious, no arousal with painful stimuli  Preoxygenated: yes  Patient position: sniffing  Neck stabilization: in-line stabilization    Mask difficulty assessment: 0 - not attempted      Procedure Outcome  Final airway type: endotracheal airway  Endotracheal airway: ETT          ETT size (mm): 8.0;   Technique used for successful ETT placement: video laryngoscopy  Devices/methods used in placement: intubating stylet  Insertion site: oral  Blade type: Macintosh;   Laryngoscope/Videolaryngoscope blade size: 3        Measured from: teeth (25)  Number of attempts at approach: 1  Placement verified by capnometry and colorimetric CO2 detector            Complications  Cardiovascular:  Pulmonary:  Procedure:airway not difficult,  Medication:  Additional notes: ED check list used  Attending Attestation: I personally performed the procedure myself.    Performed by: Paulina Fusi, MD  Authorized by: Paulina Fusi, MD    Refer to nursing documentation for vitals/monitoring data  Critical Care  Performed by: Paulina Fusi, MD  Authorized by: Paulina Fusi, MD   Total critical care time in minutes: 30  Critical care was exclusive of separately billable procedures and treating other patients and teaching time.  Critical care was necessary to treat or prevent imminent or life-threatening deterioration of the following conditions: CNS failure or compromise and toxidrome.  Critical care was spent personally by me on the following activities: blood draw for specimens, discussions with consultants, ordering and review of laboratory studies, pulse oximetry, review of old charts, re-evaluation of patient's condition, ordering and review of radiographic studies, ordering and performing treatments and interventions and evaluation of patient's response to treatment.  Attending Attestation: I personally performed the procedure myself.            Attestation / Supervision:  Jeronimo Norma, am scribing for and in the presence of Johnsie Cancel, MD.      Daryel November    Attestation / Supervision Note concerning Toni Arthurs Onley : I personally performed the E/M including history, physical exam, and MDM.    Paulina Fusi, MD

## 2021-01-02 MED ADMIN — MAGNESIUM SULFATE IN D5W 1 GRAM/100 ML IV PGBK [166578]: 1 g | INTRAVENOUS | @ 18:00:00 | Stop: 2021-01-02 | NDC 00338170940

## 2021-01-02 MED ADMIN — DEXMEDETOMIDINE IN 0.9 % NACL 400 MCG/100 ML (4 MCG/ML) IV SOLN [317250]: 0.2 ug/kg/h | INTRAVENOUS | @ 18:00:00 | NDC 43066055712

## 2021-01-02 MED ADMIN — DEXMEDETOMIDINE IN 0.9 % NACL 400 MCG/100 ML (4 MCG/ML) IV SOLN [317250]: 1.5 ug/kg/h | INTRAVENOUS | @ 21:00:00 | NDC 43066055712

## 2021-01-02 MED ADMIN — SODIUM CHLORIDE 0.9 % IV SOLP [27838]: 250 mL | INTRAVENOUS | @ 02:00:00 | Stop: 2021-01-02 | NDC 00338004902

## 2021-01-02 MED ADMIN — LORAZEPAM 2 MG/ML IJ SYRG [86485]: 4 mg | INTRAVENOUS | @ 19:00:00 | Stop: 2021-01-02 | NDC 00409198503

## 2021-01-02 MED ADMIN — LORAZEPAM 2 MG/ML IJ SYRG [86485]: 2 mg | INTRAVENOUS | @ 13:00:00 | Stop: 2021-01-02 | NDC 00409198503

## 2021-01-02 MED ADMIN — LORAZEPAM 2 MG/ML IJ SYRG [86485]: 1 mg | INTRAVENOUS | @ 19:00:00 | NDC 00409198503

## 2021-01-03 ENCOUNTER — Encounter: Admit: 2021-01-03 | Discharge: 2021-01-03 | Primary: Family

## 2021-01-03 MED ADMIN — DEXTROSE 5 %-LACTATED RINGERS IV SOLP [9788]: 1000 mL | INTRAVENOUS | @ 16:00:00 | Stop: 2021-01-04 | NDC 00338012504

## 2021-01-03 MED ADMIN — DEXMEDETOMIDINE IN 0.9 % NACL 400 MCG/100 ML (4 MCG/ML) IV SOLN [317250]: 0.9 ug/kg/h | INTRAVENOUS | @ 06:00:00 | NDC 43066055712

## 2021-01-03 MED ADMIN — THIAMINE HCL (VITAMIN B1) 100 MG/ML IJ SOLN [7876]: 100 mg | INTRAVENOUS | @ 15:00:00 | NDC 00641622801

## 2021-01-03 MED ADMIN — THIAMINE HCL (VITAMIN B1) 100 MG/ML IJ SOLN [7876]: 100 mg | INTRAVENOUS | NDC 00641622801

## 2021-01-03 MED ADMIN — DEXMEDETOMIDINE IN 0.9 % NACL 400 MCG/100 ML (4 MCG/ML) IV SOLN [317250]: 1 ug/kg/h | INTRAVENOUS | @ 01:00:00 | NDC 43066055712

## 2021-01-03 MED ADMIN — LORAZEPAM 1 MG PO TAB [4573]: 1 mg | ORAL | @ 21:00:00 | NDC 69315090501

## 2021-01-03 MED ADMIN — DEXMEDETOMIDINE IN 0.9 % NACL 400 MCG/100 ML (4 MCG/ML) IV SOLN [317250]: 0.4 ug/kg/h | INTRAVENOUS | @ 21:00:00 | NDC 43066055712

## 2021-01-03 MED ADMIN — MAGNESIUM SULFATE IN D5W 1 GRAM/100 ML IV PGBK [166578]: 1 g | INTRAVENOUS | @ 18:00:00 | Stop: 2021-01-03 | NDC 00338170940

## 2021-01-03 MED ADMIN — SODIUM CHLORIDE 0.9 % IV SOLP [27838]: 100 mg | INTRAVENOUS | @ 15:00:00 | NDC 00338004931

## 2021-01-03 MED ADMIN — MAGNESIUM SULFATE IN D5W 1 GRAM/100 ML IV PGBK [166578]: 1 g | INTRAVENOUS | @ 20:00:00 | Stop: 2021-01-03 | NDC 00338170940

## 2021-01-03 MED ADMIN — SODIUM CHLORIDE 0.9 % IV SOLP [27838]: 100 mg | INTRAVENOUS | NDC 00338004911

## 2021-01-03 MED ADMIN — DEXMEDETOMIDINE IN 0.9 % NACL 400 MCG/100 ML (4 MCG/ML) IV SOLN [317250]: 0.9 ug/kg/h | INTRAVENOUS | @ 11:00:00 | NDC 43066055712

## 2021-01-04 MED ADMIN — CLONIDINE HCL 0.1 MG PO TAB [1755]: 0.1 mg | ORAL | @ 21:00:00 | Stop: 2021-01-05 | NDC 60687011311

## 2021-01-04 MED ADMIN — LORAZEPAM 1 MG PO TAB [4573]: 1 mg | ORAL | @ 15:00:00 | NDC 00904600861

## 2021-01-04 MED ADMIN — ACETAMINOPHEN 325 MG PO TAB [101]: 650 mg | ORAL | @ 10:00:00 | NDC 00904677361

## 2021-01-04 MED ADMIN — CLONIDINE HCL 0.1 MG PO TAB [1755]: 0.1 mg | ORAL | @ 14:00:00 | Stop: 2021-01-05 | NDC 60687011311

## 2021-01-04 MED ADMIN — SODIUM CHLORIDE 0.9 % IV SOLP [27838]: 100 mg | INTRAVENOUS | @ 16:00:00 | NDC 00338004911

## 2021-01-04 MED ADMIN — LORAZEPAM 1 MG PO TAB [4573]: 1 mg | ORAL | @ 21:00:00 | NDC 00904600861

## 2021-01-04 MED ADMIN — THIAMINE HCL (VITAMIN B1) 100 MG/ML IJ SOLN [7876]: 100 mg | INTRAVENOUS | @ 16:00:00 | NDC 00641622801

## 2021-01-04 MED ADMIN — MAGNESIUM SULFATE IN D5W 1 GRAM/100 ML IV PGBK [166578]: 1 g | INTRAVENOUS | @ 15:00:00 | Stop: 2021-01-04 | NDC 00409672723

## 2021-01-04 MED ADMIN — LORAZEPAM 2 MG/ML IJ SYRG [86485]: 2 mg | INTRAVENOUS | @ 02:00:00 | NDC 00409198503

## 2021-01-04 MED ADMIN — DEXMEDETOMIDINE IN 0.9 % NACL 400 MCG/100 ML (4 MCG/ML) IV SOLN [317250]: 0.4 ug/kg/h | INTRAVENOUS | @ 05:00:00 | NDC 43066055712

## 2021-01-04 MED ADMIN — LORAZEPAM 1 MG PO TAB [4573]: 1 mg | ORAL | @ 10:00:00 | NDC 69315090501

## 2021-01-04 MED ADMIN — FLUOXETINE 20 MG PO CAP [10070]: 20 mg | ORAL | @ 23:00:00 | NDC 00904578561

## 2021-01-04 MED ADMIN — LORAZEPAM 2 MG PO TAB [4574]: 2 mg | ORAL | @ 06:00:00 | NDC 00904600961

## 2021-01-05 MED ADMIN — FLUOXETINE 20 MG PO CAP [10070]: 20 mg | ORAL | @ 14:00:00 | NDC 00904578561

## 2021-01-05 MED ADMIN — LOPERAMIDE 2 MG PO CAP [4560]: 2 mg | ORAL | @ 22:00:00 | NDC 60687022911

## 2021-01-05 MED ADMIN — SODIUM CHLORIDE 0.9 % IV SOLP [27838]: 100 mg | INTRAVENOUS | @ 16:00:00 | NDC 00338004911

## 2021-01-05 MED ADMIN — GABAPENTIN 300 MG PO CAP [18308]: 300 mg | ORAL | @ 21:00:00 | NDC 67877022305

## 2021-01-05 MED ADMIN — MELATONIN 5 MG PO TAB [168576]: 10 mg | ORAL | @ 03:00:00 | NDC 77333052025

## 2021-01-05 MED ADMIN — GABAPENTIN 300 MG PO CAP [18308]: 300 mg | ORAL | @ 14:00:00 | NDC 67877022305

## 2021-01-05 MED ADMIN — LOPERAMIDE 2 MG PO CAP [4560]: 2 mg | ORAL | @ 13:00:00 | NDC 60687022911

## 2021-01-05 MED ADMIN — THIAMINE HCL (VITAMIN B1) 100 MG/ML IJ SOLN [7876]: 100 mg | INTRAVENOUS | @ 16:00:00 | NDC 00641622801

## 2021-01-05 MED ADMIN — GABAPENTIN 300 MG PO CAP [18308]: 300 mg | ORAL | @ 03:00:00 | NDC 67877022305

## 2021-01-05 MED ADMIN — CLONIDINE HCL 0.1 MG PO TAB [1755]: 0.1 mg | ORAL | @ 03:00:00 | Stop: 2021-01-05 | NDC 60687011311

## 2021-01-05 MED ADMIN — CLONIDINE HCL 0.1 MG PO TAB [1755]: 0.2 mg | ORAL | @ 13:00:00 | Stop: 2021-01-08 | NDC 60687011311

## 2021-01-05 MED ADMIN — ACETAMINOPHEN 325 MG PO TAB [101]: 650 mg | ORAL | @ 06:00:00 | NDC 00904677361

## 2021-01-05 MED ADMIN — LORAZEPAM 1 MG PO TAB [4573]: 1 mg | ORAL | @ 03:00:00 | NDC 00904600861

## 2021-01-06 MED ADMIN — GABAPENTIN 300 MG PO CAP [18308]: 300 mg | ORAL | @ 04:00:00 | NDC 67877022305

## 2021-01-06 MED ADMIN — MELATONIN 5 MG PO TAB [168576]: 10 mg | ORAL | @ 04:00:00 | NDC 77333052025

## 2021-01-06 MED ADMIN — FLU VACC QS2021-22 6MOS UP(PF) 60 MCG (15 MCG X 4)/0.5 ML IM SYRG [457010]: 0.5 mL | INTRAMUSCULAR | @ 18:00:00 | Stop: 2021-01-06 | NDC 19515081841

## 2021-01-06 MED ADMIN — CLONIDINE HCL 0.1 MG PO TAB [1755]: 0.2 mg | ORAL | @ 04:00:00 | Stop: 2021-01-08 | NDC 60687011311

## 2021-01-06 MED ADMIN — GABAPENTIN 300 MG PO CAP [18308]: 300 mg | ORAL | @ 22:00:00 | NDC 67877022305

## 2021-01-06 MED ADMIN — FLUOXETINE 20 MG PO CAP [10070]: 20 mg | ORAL | @ 16:00:00 | NDC 00904578561

## 2021-01-06 MED ADMIN — GABAPENTIN 300 MG PO CAP [18308]: 300 mg | ORAL | @ 16:00:00 | NDC 67877022305

## 2021-01-06 MED ADMIN — MAGNESIUM SULFATE IN D5W 1 GRAM/100 ML IV PGBK [166578]: 1 g | INTRAVENOUS | @ 23:00:00 | Stop: 2021-01-07 | NDC 00409672723

## 2021-01-06 MED ADMIN — SODIUM CHLORIDE 0.9 % IV SOLP [27838]: 100 mg | INTRAVENOUS | @ 16:00:00 | NDC 00338004931

## 2021-01-06 MED ADMIN — AMLODIPINE 5 MG PO TAB [79041]: 5 mg | ORAL | @ 16:00:00 | NDC 00904637061

## 2021-01-06 MED ADMIN — THIAMINE HCL (VITAMIN B1) 100 MG/ML IJ SOLN [7876]: 100 mg | INTRAVENOUS | @ 16:00:00 | NDC 00641622801

## 2021-01-06 MED ADMIN — CLONIDINE HCL 0.1 MG PO TAB [1755]: 0.2 mg | ORAL | @ 16:00:00 | Stop: 2021-01-08 | NDC 60687011311

## 2021-01-06 MED ADMIN — LORAZEPAM 1 MG PO TAB [4573]: 1 mg | ORAL | @ 04:00:00 | NDC 00904600861

## 2021-01-06 MED ADMIN — MAGNESIUM SULFATE IN D5W 1 GRAM/100 ML IV PGBK [166578]: 1 g | INTRAVENOUS | @ 18:00:00 | Stop: 2021-01-06 | NDC 00338170940

## 2021-01-07 ENCOUNTER — Encounter: Admit: 2021-01-07 | Discharge: 2021-01-07 | Payer: PRIVATE HEALTH INSURANCE | Primary: Family

## 2021-01-07 MED ADMIN — THIAMINE HCL (VITAMIN B1) 100 MG/ML IJ SOLN [7876]: 100 mg | INTRAVENOUS | @ 15:00:00 | Stop: 2021-01-07 | NDC 63323001301

## 2021-01-07 MED ADMIN — CLONIDINE HCL 0.1 MG PO TAB [1755]: 0.2 mg | ORAL | @ 15:00:00 | Stop: 2021-01-07 | NDC 60687011311

## 2021-01-07 MED ADMIN — MELATONIN 5 MG PO TAB [168576]: 10 mg | ORAL | @ 03:00:00 | NDC 77333052025

## 2021-01-07 MED ADMIN — AMLODIPINE 5 MG PO TAB [79041]: 5 mg | ORAL | @ 15:00:00 | Stop: 2021-01-07 | NDC 00904637061

## 2021-01-07 MED ADMIN — CLONIDINE HCL 0.1 MG PO TAB [1755]: 0.2 mg | ORAL | @ 03:00:00 | Stop: 2021-01-08 | NDC 60687011311

## 2021-01-07 MED ADMIN — SODIUM CHLORIDE 0.9 % IV SOLP [27838]: 100 mg | INTRAVENOUS | @ 15:00:00 | Stop: 2021-01-07 | NDC 00338954650

## 2021-01-07 MED ADMIN — FLUOXETINE 20 MG PO CAP [10070]: 20 mg | ORAL | @ 15:00:00 | Stop: 2021-01-07 | NDC 00904578561

## 2021-01-07 MED ADMIN — GABAPENTIN 300 MG PO CAP [18308]: 300 mg | ORAL | @ 03:00:00 | NDC 67877022305

## 2021-01-07 MED ADMIN — HEPATITIS A AND B VACCINE (PF) 720 ELISA UNIT- 20 MCG/ML IM SYRG [163464]: 1 mL | INTRAMUSCULAR | @ 15:00:00 | Stop: 2021-01-07 | NDC 58160081543

## 2021-01-07 MED ADMIN — GABAPENTIN 300 MG PO CAP [18308]: 300 mg | ORAL | @ 15:00:00 | Stop: 2021-01-07 | NDC 67877022305

## 2021-01-07 MED FILL — GABAPENTIN 300 MG PO CAP: 300 mg | ORAL | 30 days supply | Qty: 270 | Fill #1 | Status: CP

## 2021-01-21 ENCOUNTER — Emergency Department: Admit: 2021-01-21 | Discharge: 2021-01-21

## 2021-01-21 ENCOUNTER — Encounter: Admit: 2021-01-21 | Discharge: 2021-01-21 | Primary: Family

## 2021-01-21 DIAGNOSIS — F32A Depression: Secondary | ICD-10-CM

## 2021-01-21 DIAGNOSIS — M5126 Other intervertebral disc displacement, lumbar region: Secondary | ICD-10-CM

## 2021-01-21 DIAGNOSIS — N200 Calculus of kidney: Secondary | ICD-10-CM

## 2021-01-21 DIAGNOSIS — R569 Unspecified convulsions: Secondary | ICD-10-CM

## 2021-01-21 DIAGNOSIS — F4310 Post-traumatic stress disorder, unspecified: Secondary | ICD-10-CM

## 2021-01-21 DIAGNOSIS — F10220 Alcohol dependence with intoxication, uncomplicated: Secondary | ICD-10-CM

## 2021-01-21 DIAGNOSIS — F1020 Alcohol dependence, uncomplicated: Secondary | ICD-10-CM

## 2021-01-21 DIAGNOSIS — K859 Acute pancreatitis without necrosis or infection, unspecified: Secondary | ICD-10-CM

## 2021-01-21 LAB — COMPREHENSIVE METABOLIC PANEL
Lab: 0.2 mg/dL — ABNORMAL LOW (ref 0.3–1.2)
Lab: 11 mg/dL (ref 7–25)
Lab: 121 mg/dL — ABNORMAL HIGH (ref 70–100)
Lab: 14 K/UL — ABNORMAL HIGH (ref 3–12)
Lab: 146 MMOL/L — ABNORMAL HIGH (ref 137–147)
Lab: 28 MMOL/L (ref 21–30)
Lab: 4.1 MMOL/L — ABNORMAL LOW (ref 3.5–5.1)
Lab: 4.8 g/dL (ref 3.5–5.0)
Lab: 46 U/L (ref 7–56)
Lab: 76 U/L — ABNORMAL HIGH (ref 7–40)
Lab: 8 g/dL — ABNORMAL LOW (ref 6.0–8.0)
Lab: 89 U/L — ABNORMAL LOW (ref 25–110)
Lab: 9.1 mg/dL — ABNORMAL HIGH (ref 8.5–10.6)
Lab: >60 mL/min (ref >60)

## 2021-01-21 LAB — MAGNESIUM: Lab: 2.1 mg/dL — ABNORMAL HIGH (ref >60)

## 2021-01-21 LAB — CBC AND DIFF
Lab: 0 K/UL (ref 0–0.20)
Lab: 0 K/UL (ref 0–0.45)
Lab: 17 (ref <20.7)
Lab: 4.7 K/UL — ABNORMAL HIGH (ref 4.5–11.0)
Lab: 4.8 M/UL (ref 4.4–5.5)

## 2021-01-21 LAB — POC BLOOD GAS VEN
Lab: 0 MMOL/L
Lab: 54 mmHg — ABNORMAL HIGH (ref 36–50)
Lab: 7.3 (ref 7.30–7.40)

## 2021-01-21 LAB — POC HEMATOCRIT
Lab: 16 g/dL — ABNORMAL HIGH (ref 13.5–16.5)
Lab: 49 % (ref 40–50)

## 2021-01-21 LAB — POC GLUCOSE: Lab: 131 mg/dL — ABNORMAL HIGH (ref 70–100)

## 2021-01-21 LAB — POC SODIUM: Lab: 147 MMOL/L (ref 137–147)

## 2021-01-21 LAB — POC POTASSIUM: Lab: 3.5 MMOL/L (ref 3.5–5.1)

## 2021-01-21 LAB — ALCOHOL LEVEL: Lab: 557 mg/dL — ABNORMAL HIGH (ref 0.4–1.24)

## 2021-01-21 MED ORDER — THIAMINE/FOLIC ACID IVPB
Freq: Once | INTRAVENOUS | 0 refills | Status: CP
Start: 2021-01-21 — End: ?
  Administered 2021-01-21 (×3): 50.000 mL via INTRAVENOUS

## 2021-01-21 MED ORDER — LACTATED RINGERS IV SOLP
2000 mL | INTRAVENOUS | 0 refills | Status: CP
Start: 2021-01-21 — End: ?
  Administered 2021-01-21: 19:00:00 2000 mL via INTRAVENOUS

## 2021-01-21 NOTE — ED Provider Notes
Daniel French is a 50 y.o. male.    Chief Complaint:  Chief Complaint   Patient presents with   ? Alcohol intoxication     Pt found sleeping in corner of parking garage. PD called EMS. Pt not responding to commands, maintaining airway.       History of Present Illness:  Patient is a 50 year old male with a past medical history of alcoholism, pancreatitis, and depression presenting today for alcohol intoxication.  Patient is well-known to this ED for frequent presentations for alcohol intoxication.  Patient was found sleeping in the corner of the [place] parking garage and PD was called who brought patient to the ED for further evaluation.  On arrival, patient is stuporous/obtunded and does not awake with painful or verbal stimuli, but will clench his eyes when eyes are forced open. He is protecting his airway.  There are no signs of trauma.  Patient otherwise unable to meaningfully participate in exam.    According to prior discharge summary and MVP social worker, patient had plans to go to a rehab facility through [facility] approximately 2 weeks ago when he was discharged.      History limited by:  Patient unresponsive (Intoxicated)      Review of Systems:  Review of Systems   Unable to perform ROS: Patient unresponsive (Intoxicated)       Allergies:  Patient has no known allergies.    Past Medical History:  Medical History:   Diagnosis Date   ? Alcoholism (HCC)    ? Depression    ? Herniation of lumbar intervertebral disc    ? Nephrolithiasis    ? Pancreatitis    ? PTSD (post-traumatic stress disorder)    ? Seizure [facility])        Past Surgical History:  Surgical History:   Procedure Laterality Date   ? HX APPENDECTOMY     ? KIDNEY STONE SURGERY         Pertinent medical and surgical history reviewed.    Social History:  Social History     Tobacco Use   ? Smoking status: Never Smoker   ? Smokeless tobacco: Current User     Types: Chew   Vaping Use   ? Vaping Use: Never used   Substance Use Topics   ? Alcohol use: Yes     Alcohol/week: 0.0 standard drinks   ? Drug use: Yes     Types: Methamphetamines, Cocaine, Heroin     Comment: pills, a baggie of some brown stuff.     Social History     Substance and Sexual Activity   Drug Use Yes   ? Types: Methamphetamines, Cocaine, Heroin    Comment: pills, a baggie of some brown stuff.             Family History:  Family History   Problem Relation Age of Onset   ? Heart Attack Other    ? Hypertension Other    ? None Reported Mother    ? Hypertension Father    ? Cancer Father    ? None Reported Sister    ? None Reported Brother    ? None Reported Sister    ? None Reported Brother        Vitals:  ED Vitals    Date and Time T BP P RR SPO2P SPO2 User   01/21/21 1400 -- 133/93 70 16 PER MINUTE 70 100 % SH   01/21/21 1146 36.5 ?C (97.7 ?F) -- -- -- -- --  HB   01/21/21 1144 -- 132/89 65 20 PER MINUTE 58 98 % HB          Physical Exam:  Physical Exam  Vitals and nursing note reviewed.   Constitutional:       General: He is not in acute distress.     Appearance: Normal appearance. He is normal weight. He is not ill-appearing.   HENT:      Head: Normocephalic and atraumatic.      Mouth/Throat:      Mouth: Mucous membranes are moist.   Eyes:      Conjunctiva/sclera: Conjunctivae normal.      Pupils: Pupils are equal, round, and reactive to light.      Comments: Pupils 3 mm and equally reactive  Patient clenches eyes when forced open   Cardiovascular:      Rate and Rhythm: Normal rate and regular rhythm.      Pulses: Normal pulses.      Heart sounds: Normal heart sounds.   Pulmonary:      Effort: Pulmonary effort is normal. No respiratory distress.      Breath sounds: Normal breath sounds.   Abdominal:      General: There is no distension.      Palpations: Abdomen is soft.      Tenderness: There is no abdominal tenderness.   Musculoskeletal:      Cervical back: Neck supple.      Right lower leg: No edema.      Left lower leg: No edema.      Comments: No [place] bone deformity or injury apreciated   Skin:     General: Skin is warm and dry.   Neurological:      Comments: Near obtunded   Psychiatric:         Speech: He is noncommunicative.         Laboratory Results:  Labs Reviewed   POC GLUCOSE - Abnormal       Result Value Ref Range Status    Glucose, POC 131 (*) 70 - 100 MG/DL Final   POC BLOOD GAS VEN - Abnormal    PH-VEN-POC 7.30  7.30 - 7.40 Final    PCO2-VEN-POC 54 (*) 36 - 50 MMHG Final    PO2-VEN-POC 41  33 - 48 MMHG Final    Base Ex-VEN-POC 0.0  MMOL/L Final    O2 Sat-VEN-POC 70.0  55 - 71 % Final    Bicarbonate-VEN-POC 26.7  MMOL/L Final   POC HEMATOCRIT - Abnormal    Hemoglobin POC 16.7 (*) 13.5 - 16.5 GM/DL Final    Hematocrit POC 49.0  40 - 50 % Final   CBC AND DIFF - Abnormal    White Blood Cells 4.7  4.5 - 11.0 K/UL Final    RBC 4.88  4.4 - 5.5 M/UL Final    Hemoglobin 15.5  13.5 - 16.5 GM/DL Final    Hematocrit 16.1  40 - 50 % Final    MCV 92.5  80 - 100 FL Final    MCH 31.9  26 - 34 PG Final    MCHC 34.5  32.0 - 36.0 G/DL Final    RDW 09.6 (*) 11 - 15 % Final    Platelet Count 588 (*) 150 - 400 K/UL Final    MPV 6.6 (*) 7 - 11 FL Final    Neutrophils 63  41 - 77 % Final    Lymphocytes 33  24 - 44 % Final  Monocytes 3 (*) 4 - 12 % Final    Eosinophils 0  0 - 5 % Final    Basophils 1  0 - 2 % Final    Absolute Neutrophil Count 2.92  1.8 - 7.0 K/UL Final    Absolute Lymph Count 1.53  1.0 - 4.8 K/UL Final    Absolute Monocyte Count 0.14  0 - 0.80 K/UL Final    Absolute Eosinophil Count 0.01  0 - 0.45 K/UL Final    Absolute Basophil Count 0.06  0 - 0.20 K/UL Final    MDW (Monocyte Distribution Width) 17.5  <20.7 Final   COMPREHENSIVE METABOLIC PANEL - Abnormal    Sodium 146  137 - 147 MMOL/L Final    Potassium 4.1  3.5 - 5.1 MMOL/L Final    Chloride 104  98 - 110 MMOL/L Final    Glucose 121 (*) 70 - 100 MG/DL Final    Blood Urea Nitrogen 11  7 - 25 MG/DL Final    Creatinine 1.61  0.4 - 1.24 MG/DL Final    Calcium 9.1  8.5 - 10.6 MG/DL Final    Total Protein 8.0  6.0 - 8.0 G/DL Final Total Bilirubin 0.2 (*) 0.3 - 1.2 MG/DL Final    Albumin 4.8  3.5 - 5.0 G/DL Final    Alk Phosphatase 89  25 - 110 U/L Final    AST (SGOT) 76 (*) 7 - 40 U/L Final    CO2 28  21 - 30 MMOL/L Final    ALT (SGPT) 46  7 - 56 U/L Final    Anion Gap 14 (*) 3 - 12 Final    eGFR >60  >60 mL/min Final   POC POTASSIUM    Potassium-POC 3.5  3.5 - 5.1 MMOL/L Final   POC SODIUM    Sodium-POC 147  137 - 147 MMOL/L Final   MAGNESIUM    Magnesium 2.1  1.6 - 2.6 mg/dL Final   ALCOHOL LEVEL    Alcohol 557  MG/DL Final     POC Glucose (Download): (!) 131    Radiology Interpretation:    CT HEAD WO CONTRAST   Final Result         Head:       No acute intracranial hemorrhage or calvarial fracture.      Cervical Spine:       1.  No evidence of acute cervical fracture or subluxation.   2.  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   3.  C5-C6 congenital ankylosis.             Finalized by Desma Maxim, M.D. on 01/21/2021 2:54 PM. Dictated by Desma Maxim, M.D. on 01/21/2021 2:27 PM.         CT SPINE CERVICAL WO CONTRAST   Final Result         Head:       No acute intracranial hemorrhage or calvarial fracture.      Cervical Spine:       1.  No evidence of acute cervical fracture or subluxation.   2.  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   3.  C5-C6 congenital ankylosis.             Finalized by Desma Maxim, M.D. on 01/21/2021 2:54 PM. Dictated by Desma Maxim, M.D. on 01/21/2021 2:27  PM.               EKG:    12 lead ECG was obtained at: 1149  Rhythm: Sinus  Rate: 72  PR Interval: 213  QTc Interval: 440  Axis Deviation: Normal axis  Comments: Otherwise NO acute ST segment changes.     ED Course:  Patient seen and evaluated by resident and attending for suspected alcohol intoxication. Previous records and initial vitals reviewed. Patient on monitor, PIV placed. Physical exam as above.  Patient is near obtunded and does not respond to painful or verbal stimuli, however, does clench his eyes shut when forced open.  He is protecting his airway.  There are no signs of trauma on his exam.  His presentation does seem consistent with prior presentations for alcohol intoxication.    Patient placed on end-tidal for further monitoring.  IV fluids and thiamine/folate Mr.  Labs drawn, notable for mild hypercapnia with normal pH.  His alcohol level is 557.  Remainder of labs are stable or near patient's baseline.  CT head/C-spine obtained to ensure no trauma given that patient was found down.  CT negative for acute injury.  EKG shows normal sinus rhythm without acute ST changes.    Patient handed off to Dr. Loralee Pacas who will continue to monitor patient.  Anticipate discharge once clinical sobriety is obtained.         ED Scoring:                           MDM  Reviewed: previous chart, nursing note and vitals  Reviewed previous: labs and ECG  Interpretation: labs, ECG and CT scan        Facility Administered Meds:  Medications   thiamine (VITAMIN B-1) 200 mg, folic acid 1 mg in sodium chloride 0.9% (NS) 50 mL IVPB ( Intravenous Infusion Stopped 01/21/21 1504)   lactated ringers infusion (0 mL Intravenous Infusion Stopped 01/21/21 1425)         Clinical Impression:  Clinical Impression   Acute alcoholic intoxication in alcoholism without complication [facility])       Disposition/Follow up  ED Disposition     None        Theodora Blow  [address]  [place] [address]  [phone]            Medications:  New Prescriptions    No medications on file       Procedure Notes:  Procedures        Attestation / Supervision:      Dahlia Client, MD  Emergency Medicine, PGY-2

## 2021-01-21 NOTE — ED Notes
50 y.o. male presents to ED 86 via EMS with c/o alcohol intoxication. Per EMS, they were called to parking garage where pt was asleep in the corner. Pt not answering questions at this time but opens eyes to voice and follows some commands. Pt giggling while staff members taking off wet clothes.     Pt  has a past medical history of Alcoholism ([place]), Depression, Herniation of lumbar intervertebral disc, Nephrolithiasis, Pancreatitis, PTSD (post-traumatic stress disorder), and Seizure ([place]).    Pt AOx4, VSS, respirations even unlabored on RA, skin warm/dry, pt resting on cart with call light in reach. RN awaiting further orders.

## 2021-01-22 MED ADMIN — PHENOBARBITAL SODIUM 130 MG/ML IJ SOLN [6221]: 260 mg | INTRAVENOUS | @ 11:00:00 | Stop: 2021-01-22 | NDC 42494041601

## 2021-01-22 MED ADMIN — PHENOBARBITAL SODIUM 130 MG/ML IJ SOLN [6221]: 130 mg | INTRAVENOUS | @ 07:00:00 | Stop: 2021-01-22 | NDC 42494041601

## 2021-01-25 ENCOUNTER — Encounter: Admit: 2021-01-25 | Discharge: 2021-01-25 | Primary: Family

## 2021-01-25 ENCOUNTER — Emergency Department: Admit: 2021-01-25 | Discharge: 2021-01-26

## 2021-01-25 ENCOUNTER — Emergency Department: Admit: 2021-01-25 | Discharge: 2021-01-25

## 2021-01-25 DIAGNOSIS — F1020 Alcohol dependence, uncomplicated: Secondary | ICD-10-CM

## 2021-01-25 LAB — COVID-19 (SARS-COV-2) PCR

## 2021-01-25 LAB — ALCOHOL LEVEL
Lab: 456 mg/dL
Lab: 324 mg/dL

## 2021-01-25 LAB — POC GLUCOSE: Lab: 82 mg/dL (ref 70–100)

## 2021-01-25 MED ORDER — PHENOBARBITAL SODIUM 130 MG/ML IJ SOLN
130 mg | Freq: Once | INTRAVENOUS | 0 refills | Status: CP
Start: 2021-01-25 — End: ?
  Administered 2021-01-25: 18:00:00 130 mg via INTRAVENOUS

## 2021-01-25 MED ORDER — HYDROXYZINE HCL 50 MG PO TAB
25 mg | Freq: Once | ORAL | 0 refills | Status: CP
Start: 2021-01-25 — End: ?
  Administered 2021-01-26: 01:00:00 25 mg via ORAL

## 2021-01-25 MED ORDER — LACTATED RINGERS IV SOLP
1000 mL | Freq: Once | INTRAVENOUS | 0 refills | Status: CP
Start: 2021-01-25 — End: ?
  Administered 2021-01-25: 16:00:00 1000 mL via INTRAVENOUS

## 2021-01-25 MED ORDER — THIAMINE/FOLIC ACID IVPB
Freq: Once | INTRAVENOUS | 0 refills | Status: CP
Start: 2021-01-25 — End: ?
  Administered 2021-01-25 (×3): 50.000 mL via INTRAVENOUS

## 2021-01-25 NOTE — ED Notes
Called to [facility] again for bed availability.  They are requiring COVID test and BAC.  MD notified

## 2021-01-25 NOTE — ED Notes
Call to [facility] for sobering bed.  Waiting for return call.

## 2021-01-25 NOTE — ED Notes
Per [facility], bed available.  BAL must be less than 300 on arrival.  Discussed with MD, plan for repeat BAL prior to transport to [facility], as they will test him on arrival and not accept if he remains >300

## 2021-01-25 NOTE — ED Notes
Pt states "I'm done, I'm done drinking."  Apologizing for being here.  Assured pt staff is here to help.

## 2021-01-25 NOTE — ED Notes
Call to [facility] detox for bed availability.  They are in the process of identifying possible bed availability at this time.  Will need to call them back at 1630

## 2021-01-25 NOTE — ED Provider Notes
Daniel French is a 50 y.o. male.    Chief Complaint:  Chief Complaint   Patient presents with   ? Alcohol Problem     drank 3-4 pints       History of Present Illness:  Daniel French is a 50 y.o. male, with a history of alcoholism, pancreatitis, depression, who presents to the emergency department for alcohol problem.  Patient states that he was over at [facility] trip earlier this morning and had multiple mechanical falls secondary to his intoxication.  A bystander called EMS who picked him up and brought him to the emergency department for evaluation.  Patient states that he drank anywhere between 2 and 4 pints last night.  He states that this is a recurring thing for him.  States that he believes that he did strike his head while falling however does not believe he lost any consciousness.  Currently denies any other medical problems including any chest pain, shortness of breath, fevers or chills, headache, vision changes, as well as any other signs or symptoms of injury.  Denies any suicidal ideation      History provided by:  Patient  Language interpreter used: No        Review of Systems:  Review of Systems   Constitutional: Negative for chills and fever.   HENT: Negative for congestion, sinus pressure and sneezing.    Eyes: Negative for pain and visual disturbance.   Respiratory: Negative for cough, chest tightness and shortness of breath.    Cardiovascular: Negative for chest pain and leg swelling.   Gastrointestinal: Negative for abdominal pain, diarrhea, nausea and vomiting.   Genitourinary: Negative for difficulty urinating, dysuria and flank pain.   Musculoskeletal: Positive for gait problem. Negative for back pain and neck pain.   Skin: Negative for rash and wound.   Neurological: Negative for syncope, weakness, light-headedness, numbness and headaches.       Allergies:  Patient has no known allergies.    Past Medical History:  Medical History:   Diagnosis Date   ? Alcoholism (HCC)    ? Depression    ? Herniation of lumbar intervertebral disc    ? Nephrolithiasis    ? Pancreatitis    ? PTSD (post-traumatic stress disorder)    ? Seizure [facility])        Past Surgical History:  Surgical History:   Procedure Laterality Date   ? HX APPENDECTOMY     ? KIDNEY STONE SURGERY         Pertinent medical/surgical history reviewed  Medical History:   Diagnosis Date   ? Alcoholism (HCC)    ? Depression    ? Herniation of lumbar intervertebral disc    ? Nephrolithiasis    ? Pancreatitis    ? PTSD (post-traumatic stress disorder)    ? Seizure [facility])      Surgical History:   Procedure Laterality Date   ? HX APPENDECTOMY     ? KIDNEY STONE SURGERY         Social History:  Social History     Tobacco Use   ? Smoking status: Never Smoker   ? Smokeless tobacco: Current User     Types: Chew   Vaping Use   ? Vaping Use: Never used   Substance Use Topics   ? Alcohol use: Yes     Alcohol/week: 0.0 standard drinks   ? Drug use: Yes     Types: Methamphetamines, Cocaine, Heroin     Comment: pills,  a baggie of some brown stuff.     Social History     Substance and Sexual Activity   Drug Use Yes   ? Types: Methamphetamines, Cocaine, Heroin    Comment: pills, a baggie of some brown stuff.             Family History:  Family History   Problem Relation Age of Onset   ? Heart Attack Other    ? Hypertension Other    ? None Reported Mother    ? Hypertension Father    ? Cancer Father    ? None Reported Sister    ? None Reported Brother    ? None Reported Sister    ? None Reported Brother        Vitals:  ED Vitals    Date and Time T BP P RR SPO2P SPO2 User   01/25/21 0900 36.5 ?C (97.7 ?F) 126/91 -- 15 PER MINUTE 84 98 % RT          Physical Exam:  Physical Exam  Vitals and nursing note reviewed.   Constitutional:       Appearance: Normal appearance.      Comments: Clearly intoxicated.   HENT:      Head: Normocephalic and atraumatic. No abrasion, contusion or laceration.        Comments: Mild tenderness over the left temporal area without any overlying signs of erythema, ecchymosis or contusion     Right Ear: External ear normal.      Left Ear: External ear normal.      Nose: Nose normal.      Mouth/Throat:      Mouth: Mucous membranes are dry.      Pharynx: Oropharynx is clear.   Eyes:      Extraocular Movements: Extraocular movements intact.      Conjunctiva/sclera: Conjunctivae normal.      Pupils: Pupils are equal, round, and reactive to light.   Cardiovascular:      Rate and Rhythm: Normal rate and regular rhythm.      Pulses: Normal pulses.      Heart sounds: Normal heart sounds.   Pulmonary:      Effort: Pulmonary effort is normal.      Breath sounds: Normal breath sounds. No wheezing, rhonchi or rales.   Abdominal:      General: Abdomen is flat. There is no distension.      Palpations: Abdomen is soft.      Tenderness: There is no abdominal tenderness.   Musculoskeletal:         General: No swelling or tenderness. Normal range of motion.      Cervical back: Normal range of motion and neck supple.      Right lower leg: No edema.      Left lower leg: No edema.   Skin:     General: Skin is warm and dry.      Capillary Refill: Capillary refill takes less than 2 seconds.   Neurological:      General: No focal deficit present.      Mental Status: He is alert and oriented to person, place, and time.         Laboratory Results:  Labs Reviewed   COVID-19 (SARS-COV-2) PCR   POC GLUCOSE       Result Value Ref Range Status    Glucose, POC 82  70 - 100 MG/DL Final   ALCOHOL LEVEL    Alcohol 456  MG/DL  Final   POC GLUCOSE          Radiology Interpretation:    CT HEAD WO CONTRAST   Final Result         Head:       No acute intracranial hemorrhage or calvarial fracture.      Cervical Spine:       1.  No evidence of acute cervical fracture or subluxation.   2.  Congenital ankylosis of C5 and C6 with moderate associated junctional degenerative central spinal stenosis at C4-5 and C6-7 and up to severe foraminal stenosis bilaterally at C6-C7.   3. Incidental focal calcification along the right orbital optic nerve segments, favored for a tiny vascular calcification.             Finalized by Dimple Casey, M.D. on 01/25/2021 10:20 AM. Dictated by Dimple Casey, M.D. on 01/25/2021 10:15 AM.         CT SPINE CERVICAL WO CONTRAST   Final Result         Head:       No acute intracranial hemorrhage or calvarial fracture.      Cervical Spine:       1.  No evidence of acute cervical fracture or subluxation.   2.  Congenital ankylosis of C5 and C6 with moderate associated junctional degenerative central spinal stenosis at C4-5 and C6-7 and up to severe foraminal stenosis bilaterally at C6-C7.   3.  Incidental focal calcification along the right orbital optic nerve segments, favored for a tiny vascular calcification.             Finalized by Dimple Casey, M.D. on 01/25/2021 10:20 AM. Dictated by Dimple Casey, M.D. on 01/25/2021 10:15 AM.             ED Course:    Patient is a 50 year old male who presents emergency department for concerns of falls while intoxicated.    Patient was seen and examined upon arrival here in the emergency department.  Vital signs reviewed and noted above.  Physical exam as noted above.    Upon initial presentation, the patient was not in any immediate distress.  He was certainly intoxicated.  His only complaint was some mild tenderness to the left temporal area however there is no overlying concerns of major injury.  Given the patient's intoxicated state, will obtain CT of the head as well as neck.  Patient was also given fluids as well as thiamine folate.  Glucose within normal limits.    Noncontrast CT of the head and neck were negative for any acute intracranial or cervical injury.    Attempted to have the patient contact his significant other for safety disposal and ride home however he was unsuccessful. He then stated that he felt like he was going to withdrawal and stated that he was starting to feel anxious. He was then given 130 mg of phenobarbital.    He then requested resources for alcohol detox. Therefore PLS and social work were able to see him at the bedside. Attempted to get the patient into [facility] however, he did not have an alcohol level Covid test. These were then ordered and obtained. His alcohol level is 450.    After recontacting and reengaging [facility], [facility] states that the patient is a [place] residents and they can no longer help him as this is a [place] run facility. PLS will continue to find placement. Patient is open to staying inpatient in the hospital for alcohol detox but stated  that he prefers outpatient.    Patient care was then  handed off to oncoming ED resident pending reevaluation and ultimate disposition.      MDM  Reviewed: vitals, nursing note and previous chart  Interpretation: labs and ECG        Facility Administered Meds:  Medications   lactated ringers infusion (0 mL Intravenous Infusion Stopped 01/25/21 1150)   thiamine (VITAMIN B-1) 200 mg, folic acid 1 mg in sodium chloride 0.9% (NS) 50 mL IVPB ( Intravenous Infusion Stopped 01/25/21 1130)   PHENobarbital injection 130 mg (130 mg Intravenous Given 01/25/21 1217)         Clinical Impression:  Clinical Impression   Alcohol use disorder, severe, dependence (HCC)       Disposition/Follow up  ED Disposition     None        No follow-up provider specified.    Medications:  New Prescriptions    No medications on file       Procedure Notes:  Procedures        Attestation / Supervision:      Lizabeth Leyden, DO  Emergency Medicine PGY 3  Pager [phone] or [facility]

## 2021-01-26 NOTE — Case Management (ED)
Case Management Progress Note    NAME:Daniel French                          MRN: [id number]              DOB:06-13-1971          AGE: 50 y.o.  ADMISSION DATE: 01/25/2021             DAYS ADMITTED: LOS: 0 days      Todays Date: 01/25/2021    Plan: Cab voucher provide to [facility].     Interventions   Support       Info or Referral       Discharge Planning        SW was notified by RN that pt is stable for discharge to [facility].    Cab voucher provided to Triage RN.      Medication Needs      o Teacher, adult education       Other        Disposition   Expected Discharge Date        Transportation       Next Level of Care (Acute Psych discharges only)       Discharge Disposition    Selected Continued Care - Admitted Since 01/25/2021    No services have been selected for the patient.       Henrietta Hoover, LMSW   Social Work Tourist information centre manager  Emergency Department  Pager [phone]

## 2021-03-03 ENCOUNTER — Encounter: Admit: 2021-03-03 | Discharge: 2021-03-03 | Primary: Family

## 2021-07-12 ENCOUNTER — Encounter: Admit: 2021-07-12 | Discharge: 2021-07-12

## 2021-07-12 ENCOUNTER — Emergency Department: Admit: 2021-07-12 | Discharge: 2021-07-12

## 2021-07-12 ENCOUNTER — Emergency Department: Admit: 2021-07-13 | Discharge: 2021-07-12

## 2021-07-12 DIAGNOSIS — F10929 Alcohol use, unspecified with intoxication, unspecified: Secondary | ICD-10-CM

## 2021-07-12 LAB — CBC AND DIFF
ABSOLUTE BASO COUNT: 0 K/UL (ref 0–0.20)
ABSOLUTE EOS COUNT: 0 K/UL (ref 0–0.45)
ABSOLUTE MONO COUNT: 0.3 K/UL (ref 0–0.80)
MDW (MONOCYTE DISTRIBUTION WIDTH): 16 (ref <20.7)
WBC COUNT: 4.9 K/UL (ref 4.5–11.0)

## 2021-07-12 LAB — COMPREHENSIVE METABOLIC PANEL
ALBUMIN: 4.1 g/dL (ref 3.5–5.0)
ALK PHOSPHATASE: 67 U/L (ref 25–110)
ALT: 15 U/L (ref 7–56)
ANION GAP: 12 K/UL (ref 3–12)
AST: 21 U/L (ref 7–40)
BLD UREA NITROGEN: 17 mg/dL (ref 7–25)
CALCIUM: 7.8 mg/dL — ABNORMAL LOW (ref 8.5–10.6)
CHLORIDE: 109 MMOL/L — ABNORMAL LOW (ref 98–110)
CO2: 23 MMOL/L (ref 21–30)
CREATININE: 0.9 mg/dL (ref 0.4–1.24)
EGFR: >60 mL/min (ref >60)
GLUCOSE,PANEL: 90 mg/dL (ref 70–100)
POTASSIUM: 4.2 MMOL/L — ABNORMAL LOW (ref 3.5–5.1)
SODIUM: 144 MMOL/L — ABNORMAL LOW (ref 137–147)
TOTAL BILIRUBIN: 0.3 mg/dL — ABNORMAL LOW (ref 0.3–1.2)
TOTAL PROTEIN: 6.4 g/dL (ref 6.0–8.0)

## 2021-07-12 LAB — ALCOHOL LEVEL: ALCOHOL: 211 mg/dL

## 2021-07-12 LAB — POC GLUCOSE: POC GLUCOSE: 103 mg/dL — ABNORMAL HIGH (ref 70–100)

## 2021-07-12 MED ORDER — THIAMINE/FOLIC ACID IVPB
Freq: Every day | INTRAVENOUS | 0 refills | Status: AC
Start: 2021-07-12 — End: ?
  Administered 2021-07-13 (×3): 50.000 mL via INTRAVENOUS

## 2021-07-13 ENCOUNTER — Encounter: Admit: 2021-07-13 | Discharge: 2021-07-13

## 2021-07-13 MED ADMIN — THIAMINE HCL (VITAMIN B1) 100 MG/ML IJ SOLN [7876]: 500 mg | INTRAVENOUS | @ 22:00:00 | Stop: 2021-07-15 | NDC 63323001301

## 2021-07-13 MED ADMIN — LORAZEPAM 2 MG/ML IJ SOLN [10467]: 2 mg | INTRAVENOUS | @ 18:00:00 | NDC 17478004001

## 2021-07-13 MED ADMIN — CLONIDINE HCL 0.1 MG PO TAB [1755]: 0.1 mg | ORAL | @ 11:00:00 | Stop: 2021-07-14 | NDC 60687011311

## 2021-07-13 MED ADMIN — SODIUM CHLORIDE 0.9 % IV SOLP [27838]: 500 mg | INTRAVENOUS | @ 17:00:00 | Stop: 2021-07-15 | NDC 00338004931

## 2021-07-13 MED ADMIN — LORAZEPAM 2 MG/ML IJ SYRG [86485]: 2 mg | INTRAVENOUS | @ 14:00:00 | NDC 00409198503

## 2021-07-13 MED ADMIN — GABAPENTIN 300 MG PO CAP [18308]: 900 mg | ORAL | @ 22:00:00 | Stop: 2021-07-16 | NDC 67877022305

## 2021-07-13 MED ADMIN — SODIUM CHLORIDE 0.9 % IV SOLP [27838]: 500 mg | INTRAVENOUS | @ 22:00:00 | Stop: 2021-07-15 | NDC 00338004931

## 2021-07-13 MED ADMIN — LISINOPRIL 20 MG PO TAB [4526]: 20 mg | ORAL | @ 14:00:00 | NDC 00904679961

## 2021-07-13 MED ADMIN — CLONIDINE HCL 0.1 MG PO TAB [1755]: 0.1 mg | ORAL | @ 22:00:00 | Stop: 2021-07-14 | NDC 60687011311

## 2021-07-13 MED ADMIN — FOLIC ACID 1 MG PO TAB [3233]: 1 mg | ORAL | @ 22:00:00 | NDC 62584089711

## 2021-07-13 MED ADMIN — THIAMINE HCL (VITAMIN B1) 100 MG/ML IJ SOLN [7876]: 500 mg | INTRAVENOUS | @ 17:00:00 | Stop: 2021-07-15 | NDC 63323001301

## 2021-07-13 MED ADMIN — GABAPENTIN 300 MG PO CAP [18308]: 900 mg | ORAL | @ 11:00:00 | Stop: 2021-07-16 | NDC 67877022305

## 2021-07-13 NOTE — ED Notes
Pt sleeping in cart. Breathing even and unlabored. Pt on 1 L NC with SpO2 of 99%.

## 2021-07-14 ENCOUNTER — Encounter: Admit: 2021-07-14 | Discharge: 2021-07-14

## 2021-07-14 MED ADMIN — GABAPENTIN 300 MG PO CAP [18308]: 900 mg | ORAL | @ 12:00:00 | Stop: 2021-07-14 | NDC 67877022305

## 2021-07-14 MED ADMIN — FOLIC ACID 1 MG PO TAB [3233]: 1 mg | ORAL | @ 13:00:00 | Stop: 2021-07-14 | NDC 62584089711

## 2021-07-14 MED ADMIN — MELATONIN 5 MG PO TAB [168576]: 10 mg | ORAL | @ 02:00:00 | NDC 77333052025

## 2021-07-14 MED ADMIN — GABAPENTIN 300 MG PO CAP [18308]: 900 mg | ORAL | @ 02:00:00 | Stop: 2021-07-16 | NDC 67877022305

## 2021-07-14 MED ADMIN — THIAMINE HCL (VITAMIN B1) 100 MG/ML IJ SOLN [7876]: 500 mg | INTRAVENOUS | @ 02:00:00 | Stop: 2021-07-15 | NDC 63323001301

## 2021-07-14 MED ADMIN — SODIUM CHLORIDE 0.9 % IV SOLP [27838]: 500 mg | INTRAVENOUS | @ 14:00:00 | Stop: 2021-07-14 | NDC 00338004931

## 2021-07-14 MED ADMIN — CLONIDINE HCL 0.1 MG PO TAB [1755]: 0.2 mg | ORAL | @ 12:00:00 | Stop: 2021-07-14 | NDC 60687011311

## 2021-07-14 MED ADMIN — SODIUM CHLORIDE 0.9 % IV SOLP [27838]: 500 mg | INTRAVENOUS | @ 02:00:00 | Stop: 2021-07-15 | NDC 00338004931

## 2021-07-14 MED ADMIN — ENOXAPARIN 40 MG/0.4 ML SC SYRG [85052]: 40 mg | SUBCUTANEOUS | @ 02:00:00 | NDC 00781324602

## 2021-07-14 MED ADMIN — THIAMINE HCL (VITAMIN B1) 100 MG/ML IJ SOLN [7876]: 500 mg | INTRAVENOUS | @ 14:00:00 | Stop: 2021-07-14 | NDC 63323001301

## 2021-07-14 MED ADMIN — CLONIDINE HCL 0.1 MG PO TAB [1755]: 0.1 mg | ORAL | @ 02:00:00 | Stop: 2021-07-14 | NDC 60687011311

## 2021-07-14 MED ADMIN — FLUOXETINE 20 MG PO CAP [10070]: 20 mg | ORAL | @ 02:00:00 | NDC 00904578561

## 2021-07-14 MED FILL — GABAPENTIN 300 MG PO CAP: 300 mg | ORAL | 6 days supply | Qty: 36 | Fill #1 | Status: CP

## 2021-07-18 ENCOUNTER — Emergency Department: Admit: 2021-07-18 | Discharge: 2021-07-18

## 2021-07-18 ENCOUNTER — Encounter: Admit: 2021-07-18 | Discharge: 2021-07-18

## 2021-07-18 DIAGNOSIS — F10220 Alcohol dependence with intoxication, uncomplicated: Secondary | ICD-10-CM

## 2021-07-18 LAB — COMPREHENSIVE METABOLIC PANEL
ALBUMIN: 4.5 g/dL (ref 3.5–5.0)
ALK PHOSPHATASE: 70 U/L (ref 25–110)
ALT: 20 U/L (ref 7–56)
ANION GAP: 14 K/UL — ABNORMAL HIGH (ref 3–12)
AST: 15 U/L (ref 7–40)
BLD UREA NITROGEN: 15 mg/dL (ref 7–25)
CALCIUM: 8.7 mg/dL (ref 8.5–10.6)
CO2: 26 MMOL/L (ref 21–30)
CREATININE: 0.8 mg/dL (ref 0.4–1.24)
EGFR: >60 mL/min (ref >60)
GLUCOSE,PANEL: 97 mg/dL (ref 70–100)
SODIUM: 147 MMOL/L (ref 137–147)
TOTAL BILIRUBIN: 0.4 mg/dL — ABNORMAL LOW (ref 0.3–1.2)
TOTAL PROTEIN: 7.1 g/dL (ref 6.0–8.0)

## 2021-07-18 LAB — CBC AND DIFF
ABSOLUTE BASO COUNT: 0 K/UL (ref 0–0.20)
ABSOLUTE EOS COUNT: 0 K/UL (ref 0–0.45)
ABSOLUTE MONO COUNT: 0.4 K/UL (ref 0–0.80)
MDW (MONOCYTE DISTRIBUTION WIDTH): 14 (ref <20.7)
WBC COUNT: 6.4 K/UL (ref 4.5–11.0)

## 2021-07-18 LAB — LIPASE: LIPASE: 56 U/L (ref 11–82)

## 2021-07-18 LAB — POC TROPONIN: TROPONIN I, POC: 0 ng/mL (ref 0.00–0.05)

## 2021-07-18 LAB — MAGNESIUM: MAGNESIUM: 2 mg/dL — ABNORMAL LOW (ref 1.6–2.6)

## 2021-07-18 MED ORDER — LACTATED RINGERS IV SOLP
1000 mL | INTRAVENOUS | 0 refills | Status: CP
Start: 2021-07-18 — End: ?
  Administered 2021-07-18: 14:00:00 1000 mL via INTRAVENOUS

## 2021-07-18 NOTE — ED Notes
This RN along with 2 techs walked into pts room and found pt had left. Pt pulled IV out and blood droplets noted to be all over the bed and floor. Pts girlfriend was notified that pt had left and pts girlfriend advised that the pt had called her stating he had walked out of the hospital.     Pt was last seen at approx 1040 by this RN and was noted to be in his room asleep.

## 2021-07-18 NOTE — ED Notes
Interviewed and examined patient who presents for epigastric pain and alcohol intoxication.  Ordered work-up to evaluate with plans to observe.  Informed by nursing at approximately 1130 that patient had left the ER.  On my exam prior to leaving, patient denied SI/HI, denied AH/VH.    Patient [facility]TC without my knowledge.    Orbie Pyo, MD

## 2021-07-18 NOTE — ED Provider Notes
Daniel French is a 50 y.o. male.    Chief Complaint:  Chief Complaint   Patient presents with   ? Chest Pain   ? Alcohol intoxication       History of Present Illness:  Patient is a 50 year old male with past medical history of seizure, alcohol use disorder, pancreatitis, PTSD, depression presenting to emergency department with alcohol intoxication and chest pain. Per chart review, patient was recently admitted from 8/13-8/15 for altered mental status in the setting of acute alcohol intoxication.  Patient was discharged on gabapentin taper. Patient's girlfriend reports the patient left the house this morning around 6:15am and was out drinking. She states she received a call from him that he was at the bus stop and wanted to be picked up. Patient's girlfriend mentions the patient having SI, however patient currently denying any SI adamantly.  Patient also denies any HI or hallucinations.  When asked how much alcohol he drank, patient says too much. Right now, patient is endorsing non-radiating low mid substernal chest pain/epigastric pain, which he notes is similar to his history of pancreatitis. Patient endorses a history of alcohol withdrawal including seizures. Denies having any recent fevers, cough, diarrhea, constipation, or urinary symptoms.    Patient's history limited due to alcohol intoxication.      History provided by:  Patient  History limited by:  Acuity of condition  Language interpreter used: No        Review of Systems:  Review of Systems   Reason unable to perform ROS: alcohol intoxication.       Allergies:  Patient has no known allergies.     Past Medical History:  Medical History:   Diagnosis Date   ? Alcoholism (HCC)    ? Depression    ? Herniation of lumbar intervertebral disc    ? Nephrolithiasis    ? Pancreatitis    ? PTSD (post-traumatic stress disorder)    ? Seizure [facility])        Past Surgical History:  Surgical History:   Procedure Laterality Date   ? HX APPENDECTOMY     ? KIDNEY STONE SURGERY         Pertinent medical and surgical history reviewed    Social History:  Social History     Tobacco Use   ? Smoking status: Never Smoker   ? Smokeless tobacco: Current User     Types: Chew   Vaping Use   ? Vaping Use: Never used   Substance Use Topics   ? Alcohol use: Yes     Alcohol/week: 0.0 standard drinks   ? Drug use: Yes     Types: Methamphetamines, Cocaine, Heroin     Comment: pills, a baggie of some brown stuff.     Social History     Substance and Sexual Activity   Drug Use Yes   ? Types: Methamphetamines, Cocaine, Heroin    Comment: pills, a baggie of some brown stuff.             Family History:  Family History   Problem Relation Age of Onset   ? Heart Attack Other    ? Hypertension Other    ? None Reported Mother    ? Hypertension Father    ? Cancer Father    ? None Reported Sister    ? None Reported Brother    ? None Reported Sister    ? None Reported Brother        Vitals:  ED  Vitals    Date and Time T BP P RR SPO2P SPO2 User   07/18/21 0839 -- 137/87 -- -- 84 93 % JB   07/18/21 0807 -- 151/97 -- -- -- -- JB   07/18/21 0758 37.1 ?C (98.8 ?F) 140/125 116 18 PER MINUTE -- 95 % NH          Physical Exam:  Physical Exam  Vitals and nursing note reviewed.   Constitutional:       Appearance: Normal appearance. He is normal weight.      Comments: Patient in very good spirits, smiling and talkative   HENT:      Head: Normocephalic and atraumatic.      Comments: Rosy cheeks  Eyes:      Extraocular Movements: Extraocular movements intact.      Conjunctiva/sclera: Conjunctivae normal.   Cardiovascular:      Rate and Rhythm: Regular rhythm. Tachycardia present.      Pulses: Normal pulses.      Heart sounds: Normal heart sounds.   Pulmonary:      Effort: Pulmonary effort is normal.      Breath sounds: Normal breath sounds.   Abdominal:      General: There is no distension.      Palpations: Abdomen is soft.      Comments: Mild tenderness palpation of epigastrium   Musculoskeletal: General: Normal range of motion.      Cervical back: Neck supple.   Skin:     General: Skin is warm and dry.   Neurological:      Mental Status: He is oriented to person, place, and time. Mental status is at baseline.      Sensory: Sensation is intact.      Comments: Slurred speech consistent with alcohol intoxication.  Patient moving all extremities without deficit noted.  5/5 strength in all extremities.  Sensation intact in all extremities.   Psychiatric:         Mood and Affect: Mood is elated.         Behavior: Behavior is slowed.         Laboratory Results:  Labs Reviewed   CBC AND DIFF - Abnormal       Result Value Ref Range Status    White Blood Cells 6.4  4.5 - 11.0 K/UL Final    RBC 4.41  4.4 - 5.5 M/UL Final    Hemoglobin 13.7  13.5 - 16.5 GM/DL Final    Hematocrit 16.1 (*) 40 - 50 % Final    MCV 88.7  80 - 100 FL Final    MCH 31.0  26 - 34 PG Final    MCHC 35.0  32.0 - 36.0 G/DL Final    RDW 09.6  11 - 15 % Final    Platelet Count 355  150 - 400 K/UL Final    MPV 6.7 (*) 7 - 11 FL Final    Neutrophils 62  41 - 77 % Final    Lymphocytes 30  24 - 44 % Final    Monocytes 7  4 - 12 % Final    Eosinophils 1  0 - 5 % Final    Basophils 0  0 - 2 % Final    Absolute Neutrophil Count 3.94  1.8 - 7.0 K/UL Final    Absolute Lymph Count 1.95  1.0 - 4.8 K/UL Final    Absolute Monocyte Count 0.43  0 - 0.80 K/UL Final    Absolute Eosinophil Count  0.08  0 - 0.45 K/UL Final    Absolute Basophil Count 0.02  0 - 0.20 K/UL Final    MDW (Monocyte Distribution Width) 14.9  <20.7 Final   COMPREHENSIVE METABOLIC PANEL - Abnormal    Sodium 147  137 - 147 MMOL/L Final    Potassium 3.6  3.5 - 5.1 MMOL/L Final    Chloride 107  98 - 110 MMOL/L Final    Glucose 97  70 - 100 MG/DL Final    Blood Urea Nitrogen 15  7 - 25 MG/DL Final    Creatinine 1.61  0.4 - 1.24 MG/DL Final    Calcium 8.7  8.5 - 10.6 MG/DL Final    Total Protein 7.1  6.0 - 8.0 G/DL Final    Total Bilirubin 0.4  0.3 - 1.2 MG/DL Final    Albumin 4.5  3.5 - 5.0 G/DL Final    Alk Phosphatase 70  25 - 110 U/L Final    AST (SGOT) 15  7 - 40 U/L Final    CO2 26  21 - 30 MMOL/L Final    ALT (SGPT) 20  7 - 56 U/L Final    Anion Gap 14 (*) 3 - 12 Final    eGFR >60  >60 mL/min Final   LIPASE    Lipase 56  11 - 82 U/L Final   MAGNESIUM    Magnesium 2.0  1.6 - 2.6 mg/dL Final   POC TROPONIN    Troponin-I-POC 0.00  0.00 - 0.05 NG/ML Final          Radiology Interpretation:    CHEST SINGLE VIEW   Final Result         No acute cardiopulmonary abnormality.          Finalized by Golda Acre, M.D. on 07/18/2021 8:58 AM. Dictated by Golda Acre, M.D. on 07/18/2021 8:58 AM.               EKG:  All results for 12-Lead ECG this visit   ECG 12-LEAD    Collection Time: 07/18/21  7:53 AM   Result Value Status    VENTRICULAR RATE 113 Incomplete    P-R INTERVAL 196 Incomplete    QRS DURATION 90 Incomplete    Q-T INTERVAL 308 Incomplete    QTC CALCULATION (BAZETT) 422 Incomplete    P AXIS 56 Incomplete    R AXIS 67 Incomplete    T AXIS 41 Incomplete    Impression    Sinus tachycardia  Otherwise normal ECG  No previous ECGs available       ED Course:  50 y.o. male presenting to emergency department for alcohol intoxication and epigastric pain.     Objective data  -vitals signs initially with tachycardia which improved with IV fluids.  -physical exam as noted above.   -EKG interpretation as noted above    Labs:  -acutely unremarkable    Imaging:  -Chest x-ray without acute process    Interventions:  -IV fluids.    Disposition:  -Initial plan was to observe patient to sobriety with suspected discharge however patient left before treatment complete without my knowledge.  Patient denied any SI, HI, auditory visual hallucination.  Patient was noted being witnessed by nurse ambulating with steady gait.         ED Scoring:  MDM  Reviewed: previous chart, nursing note and vitals  Interpretation: ECG, x-ray and labs        Facility Administered Meds:  Medications   lactated ringers infusion (0 mL Intravenous Infusion Stopped 07/18/21 1100)       Clinical Impression:  Clinical Impression   Acute alcoholic intoxication in alcoholism without complication (HCC)       Disposition/Follow up  ED Disposition     ED Disposition   LBTC           No follow-up provider specified.    Medications:  Discharge Medication List as of 07/18/2021 11:14 AM          Procedure Notes:  Procedures      Attestation / Supervision:  Julieta Bellini, am scribing for and in the presence of Daylene Posey, MD.    Deanne Coffer    I, Daylene Posey, MD, personally performed the services described in this documentation as scribed and it is both accurate and complete.    Daylene Posey, MD  Emergency Medicine, PGY-3

## 2021-07-18 NOTE — ED Notes
Pt is an alert 50yo M who presents to the ED via POV c/o lower substernal CP and headache. Pts girlfriend at the bedside states the pt left the house at 0615 this morning and drank alcohol from 0615 until approx 0730. He then went to a bus stop where he called her and asked her to come pick him up and bring him to the ER. Girlfriend states the pt c/o CP and "wanting to die." The pt was asked multiple times about SI and denies any thoughts of wanting to harm himself. Pt has strong odor of alcohol coming from him and reports drinking this morning. Pt noted to have unsteady gait. Pt denies N/V, SOA, diarrhea, fever, abdominal pain. Pt placed on BP and SPO2 monitor. Pt has 20g IV in left hand. Side rails up x 2 and call light within reach.     Belongings: cell phone, shoes, shirt, jeans.    Medical History:   Diagnosis Date    Alcoholism ([place])     Depression     Herniation of lumbar intervertebral disc     Nephrolithiasis     Pancreatitis     PTSD (post-traumatic stress disorder)     Seizure ([place])      Surgical History:   Procedure Laterality Date    HX APPENDECTOMY      KIDNEY STONE SURGERY       Family History   Problem Relation Age of Onset    Heart Attack Other     Hypertension Other     None Reported Mother     Hypertension Father     Cancer Father     None Reported Sister     None Reported Brother     None Reported Sister     None Reported Brother      Social History     Socioeconomic History    Marital status: Single   Tobacco Use    Smoking status: Never Smoker    Smokeless tobacco: Current User     Types: Chew   Vaping Use    Vaping Use: Never used   Substance and Sexual Activity    Alcohol use: Yes     Alcohol/week: 0.0 standard drinks    Drug use: Yes     Types: Methamphetamines, Cocaine, Heroin     Comment: pills, "a baggie of some brown stuff."   Social History Narrative    ** Merged History Encounter **          Vaping/E-liquid Use    Vaping Use Never User

## 2021-07-19 ENCOUNTER — Emergency Department: Admit: 2021-07-19 | Discharge: 2021-07-19

## 2021-07-19 ENCOUNTER — Encounter: Admit: 2021-07-19 | Discharge: 2021-07-19

## 2021-07-19 DIAGNOSIS — F10220 Alcohol dependence with intoxication, uncomplicated: Secondary | ICD-10-CM

## 2021-07-19 LAB — COMPREHENSIVE METABOLIC PANEL
ALBUMIN: 4.3 g/dL (ref 3.5–5.0)
ALK PHOSPHATASE: 69 U/L (ref 25–110)
ALT: 19 U/L (ref 7–56)
ANION GAP: 17 K/UL — ABNORMAL HIGH (ref 3–12)
AST: 27 U/L (ref 7–40)
CHLORIDE: 109 MMOL/L — ABNORMAL LOW (ref 98–110)
CO2: 24 MMOL/L (ref 21–30)
CREATININE: 0.7 mg/dL (ref 0.4–1.24)
GLUCOSE,PANEL: 111 mg/dL — ABNORMAL HIGH (ref 70–100)
SODIUM: 150 MMOL/L — ABNORMAL HIGH (ref 137–147)
TOTAL BILIRUBIN: 0.5 mg/dL (ref 0.3–1.2)
TOTAL PROTEIN: 7.2 g/dL — ABNORMAL LOW (ref 6.0–8.0)

## 2021-07-19 LAB — CBC AND DIFF
ABSOLUTE BASO COUNT: 0 K/UL (ref 0–0.20)
ABSOLUTE EOS COUNT: 0 K/UL (ref 0–0.45)
ABSOLUTE LYMPH COUNT: 1.7 K/UL (ref >60)
ABSOLUTE MONO COUNT: 0.1 K/UL (ref 0–0.80)
MCH: 31 pg (ref 26–34)
MDW (MONOCYTE DISTRIBUTION WIDTH): 17 (ref <20.7)
RDW: 13 % (ref 11–15)
WBC COUNT: 4.2 K/UL — ABNORMAL LOW (ref 4.5–11.0)

## 2021-07-19 LAB — LIPASE: LIPASE: 34 U/L — ABNORMAL LOW (ref 11–82)

## 2021-07-19 MED ORDER — LACTATED RINGERS IV SOLP
1000 mL | INTRAVENOUS | 0 refills | Status: CP
Start: 2021-07-19 — End: ?
  Administered 2021-07-19: 19:00:00 1000 mL via INTRAVENOUS

## 2021-07-19 MED ORDER — LORAZEPAM 1 MG PO TAB
1 mg | Freq: Once | ORAL | 0 refills | Status: CP
Start: 2021-07-19 — End: ?
  Administered 2021-07-19: 23:00:00 1 mg via ORAL

## 2021-07-19 MED ORDER — THIAMINE/FOLIC ACID IVPB
Freq: Once | INTRAVENOUS | 0 refills | Status: CP
Start: 2021-07-19 — End: ?
  Administered 2021-07-19 (×3): 50.000 mL via INTRAVENOUS

## 2021-07-19 NOTE — Unmapped
Thank you for choosing [facility] and the Department of Emergency Medicine for your healthcare needs.      If your condition required a prescription medication, it has been e-prescribed (sent electronically by computer) to the preferred pharmacy that we have on file for you and you do not need a paper copy of the prescription.  Please pick up your medication from your pharmacy as soon as possible.  If your pharmacy does not yet accept electronic prescriptions, a paper copy of the prescription has been provided to you and you must take this to the pharmacy of your choice to have it filled.  Please review your after visit summary to ensure that we have the correct pharmacy on file for you.  Please let us know immediately if we need to update your information to reflect your current and preferred pharmacy.  Please contact us immediately if you encounter any problems related to the electronic prescribing process.  Please call or return to the emergency department if you are unable to obtain your prescription medication.  Any request for medication refills should be directed to your primary care physician.    Please follow up with the designated physician(s) as instructed.  If you do not have a primary care physician, you need to establish care with one.  Ask your physician to obtain your records and go over all results in detail.  Some of the results provided to you today may be preliminary results and significant changes will be provided to you as necessary.  However, there may be incidental findings unrelated to the reason(s) for today's visit that will require non-emergent follow up in the near future by you and your primary care physician.      If you received any narcotic pain medications or sedatives while in the emergency department, you should NOT drive and you should not drive or operate machinery for 24 hours or while on those medications.    If your blood pressure was over 130/90, you should see your doctor to get your blood pressure rechecked.  Your doctor may start medications to control your blood pressure.      If your blood sugar was elevated, you should see your doctor to get your blood sugar rechecked.  Your doctor may start medications to control your blood sugar.      If you have been diagnosed with heart failure, weigh yourself daily and notify your physician of a weight gain of more than 2 pounds in a day or 3-5 pounds in a week.    If you were diagnosed with seizures, you may NOT drive for the next 6 months -- this is state law.  You should not drive, operate any heavy machinery, bathe or shower while home alone or with the bathroom door locked, swim alone, climb up on a ladder, use firearms, or do anything that would put you or others in danger should you have another seizure during that activity.  Please schedule an appointment for follow-up with both your primary care physician and with the department of neurology.    If you have a wound, we have done our best to clean and care for the injury.  There may be retained foreign bodies that could not be seen, found, or removed.  Watch for signs of infection (redness, warmth, swelling, discharge, fever) and return to the emergency department, or follow-up with your regular physician, if any of these occur.  Sutures on the face should be removed  in 5-7 days, or as otherwise instructed.  Sutures and staples on other areas of the body should be removed in 10-14 days, or as otherwise instructed.  Do not put any petroleum-based antibiotic ointment on wound adhesive (glue) because it will cause the adhesive to break down and fail.  You may safely shower and cleanse your repaired wounds with soap and water after 24 hours, but do not soak wounds or get them wet for prolonged periods of time (no soaking, bathing, or swimming).    Alcohol and drugs are bad for your health.  If you are under the age of 37 you cannot drink alcohol legally.  If you are 30 years of age or older and you drink alcohol, you should not drink in excess, you should only drink responsibly, and you should never drink and drive.  If you live in a state that has legalized marijuana, please use responsibly.  Do NOT use illegal drugs such as cocaine, methamphetamine, heroin, or phencyclidine.  If you are addicted to any of these substances and are ready to quit there are resources to help you do so.  Please ask any health care provider for help.    Smoking, vaping or chewing tobacco is bad for your health.  Do not start using these products.  If you use any form of tobacco, it is highly recommended that you stop using these products.  You may need help to quit using tobacco products and you should ask any of your health care providers about resources available to assist in quitting.       You may return to the emergency department at any time and for any health care concern that you believe is in need of emergent, urgent, or timely evaluation.

## 2021-07-19 NOTE — ED Notes
Pt presented to ED for ETOH. Pt is currently A&Ox4. PIV removed. VSS. Pt was given discharge instructions and has no further questions or concerns at this time. Pt ambulated out of ED with steady gait. Pt in possession of all belongings.

## 2021-07-21 ENCOUNTER — Emergency Department: Admit: 2021-07-21 | Discharge: 2021-07-21 | Discharge status: Against Medical Advice

## 2021-07-21 ENCOUNTER — Encounter: Admit: 2021-07-21 | Discharge: 2021-07-21

## 2021-07-21 DIAGNOSIS — R569 Unspecified convulsions: Secondary | ICD-10-CM

## 2021-07-21 DIAGNOSIS — M5126 Other intervertebral disc displacement, lumbar region: Secondary | ICD-10-CM

## 2021-07-21 DIAGNOSIS — F4310 Post-traumatic stress disorder, unspecified: Secondary | ICD-10-CM

## 2021-07-21 DIAGNOSIS — N200 Calculus of kidney: Secondary | ICD-10-CM

## 2021-07-21 DIAGNOSIS — F1020 Alcohol dependence, uncomplicated: Secondary | ICD-10-CM

## 2021-07-21 DIAGNOSIS — K859 Acute pancreatitis without necrosis or infection, unspecified: Secondary | ICD-10-CM

## 2021-07-21 DIAGNOSIS — F32A Depression: Secondary | ICD-10-CM

## 2021-07-21 NOTE — ED Notes
50 y.o. male presents to [id number] with cc of chest pain. When asked how [place] it's been going on pt states "I don't know maybe a couple days." Pt having difficulty elaborating on arrival complaint but states "it just feels like pressure". Pt denies any other medical complaints. Pt AxO x4, breathing even and unlabored on RA, skin race appropriate, bed in lowest locked position, pt in no acute distress.     All belongings remain with pt.

## 2021-07-21 NOTE — ED Notes
Dr. Verdis Frederickson at bedside for MD evaluation and patient still has not returned to his room; assumed to have LWBS.

## 2021-07-21 NOTE — ED Notes
Patient was found to be sleeping on the waiting room floor using his shoes as a pillow. Patient wakes to verbal stimuli and notified that he is not allowed to sleep in the waiting room since he is not a patient of the hospital. Patient then declines to check into the department and asks to use the phone to call for a ride. Patient then overheard arguing with the person on the other line and states, "Come fucking pick me up! What do you want me to do? Sleep on the street?" Patient then increasingly cantankerous and loud as he is arguing and cursing on the phone. He then eventually said, "Well, fuck you then!" and slammed down the phone and proceeded to ambulate with a steady gait out of the department. Approximately one minute later patient returns and states, "I have chest pain." Patient is disgruntled with ED staff asks about the legitimacy of his complaint secondary to overheard phone conversation and the fact that he doesn't have any place to sleep. Patient then states, "Stop being so fucking nosey. I'm glad you're all up in my business.  [facility] made aware of patient arrival as he is well known his malingering and misuse of the emergency department.

## 2021-08-23 ENCOUNTER — Encounter: Admit: 2021-08-23 | Discharge: 2021-08-23

## 2021-08-23 DIAGNOSIS — K859 Acute pancreatitis without necrosis or infection, unspecified: Secondary | ICD-10-CM

## 2021-08-23 DIAGNOSIS — N200 Calculus of kidney: Secondary | ICD-10-CM

## 2021-08-23 DIAGNOSIS — M5126 Other intervertebral disc displacement, lumbar region: Secondary | ICD-10-CM

## 2021-08-23 DIAGNOSIS — R569 Unspecified convulsions: Secondary | ICD-10-CM

## 2021-08-23 DIAGNOSIS — F1020 Alcohol dependence, uncomplicated: Secondary | ICD-10-CM

## 2021-08-23 DIAGNOSIS — F32A Depression: Secondary | ICD-10-CM

## 2021-08-23 DIAGNOSIS — F4310 Post-traumatic stress disorder, unspecified: Secondary | ICD-10-CM

## 2021-08-24 ENCOUNTER — Emergency Department: Admit: 2021-08-24 | Discharge: 2021-08-23

## 2021-08-24 MED ADMIN — MAGNESIUM OXIDE 400 MG (241.3 MG MAGNESIUM) PO TAB [10491]: 800 mg | ORAL | @ 10:00:00 | Stop: 2021-08-24 | NDC 10006070028

## 2021-08-24 MED ADMIN — HYDROXYZINE HCL 50 MG PO TAB [3775]: 25 mg | ORAL | @ 08:00:00 | Stop: 2021-08-24 | NDC 00904661861

## 2021-08-24 MED ADMIN — POTASSIUM CHLORIDE 20 MEQ PO TBTQ [35943]: 40 meq | ORAL | @ 10:00:00 | Stop: 2021-08-24 | NDC 00245531989

## 2021-08-24 MED ADMIN — LACTATED RINGERS IV SOLP [4318]: 1000 mL | INTRAVENOUS | @ 08:00:00 | Stop: 2021-08-24 | NDC 00338011704

## 2021-08-24 MED ADMIN — FOLIC ACID 1 MG PO TAB [3233]: 1 mg | ORAL | @ 08:00:00 | Stop: 2021-08-24 | NDC 60687068111

## 2021-08-24 MED ADMIN — THIAMINE MONONITRATE (VIT B1) 100 MG PO TAB [303868]: 100 mg | ORAL | @ 08:00:00 | Stop: 2021-08-24 | NDC 79854020010

## 2021-11-20 ENCOUNTER — Emergency Department: Admit: 2021-11-20 | Discharge: 2021-11-20

## 2021-11-20 ENCOUNTER — Encounter: Admit: 2021-11-20 | Discharge: 2021-11-20

## 2021-11-20 DIAGNOSIS — S0990XA Unspecified injury of head, initial encounter: Secondary | ICD-10-CM

## 2021-11-20 DIAGNOSIS — W19XXXA Unspecified fall, initial encounter: Secondary | ICD-10-CM

## 2021-11-20 DIAGNOSIS — F10220 Alcohol dependence with intoxication, uncomplicated: Secondary | ICD-10-CM

## 2021-11-20 DIAGNOSIS — S0191XA Laceration without foreign body of unspecified part of head, initial encounter: Secondary | ICD-10-CM

## 2021-11-20 LAB — POC GLUCOSE: POC GLUCOSE: 153 mg/dL — ABNORMAL HIGH (ref 70–100)

## 2021-11-20 MED ORDER — LIDOCAINE-RACEPINEP-TETRACAINE 4-0.05-0.5 % TP GEL
Freq: Once | TOPICAL | 0 refills | Status: DC
Start: 2021-11-20 — End: 2021-11-20

## 2021-11-20 MED ORDER — LIDOCAINE HCL 10 MG/ML (1 %) IJ SOLN
50 mL | Freq: Once | INTRAMUSCULAR | 0 refills | Status: CP
Start: 2021-11-20 — End: ?

## 2021-11-20 MED ORDER — SODIUM CHLORIDE 0.9 % IR SOLN
Freq: Once | 0 refills | Status: CP
Start: 2021-11-20 — End: ?
  Administered 2021-11-20: 13:00:00 1000.000 mL

## 2021-11-20 MED ADMIN — LIDOCAINE HCL 10 MG/ML (1 %) IJ SOLN [4452]: 50 mL | INTRAMUSCULAR | @ 13:00:00 | Stop: 2021-11-20 | NDC 00143957701

## 2021-11-20 NOTE — Progress Notes
Continuum of Care     Multiple Visit Patient - Case Management Note      NAME: Daniel French MRN: [id number] DOB: September 27, 1971  AGE: 50 y.o.    Admits in past 12 mos: ED Visits: 19 Admissions: 4     Primary MVP Case Manager: Kandee Keen    Reporting Out: SW coordinated with ED Team and met with Pt at bedside. Provide Pt w/ warming shelter and substance abuse treatment resources.    Driver of Utilization: Behavioral; substance use, mental health (anxiety); Social; basic needs    Disposition: ED Discharge    Interventions:   ? SW coordinated with ED team on Pt presentation, multi-visit patient status, and disposition. Noted need for safe D/C plan d/t Pt?s reported homelessness.   ? Met w/ Pt at bedside and discussed the following:   o Pt reported he has ?no where to go? and that he ?came from the streets?. Pt stated that he has not been homeless for ?very Daniel French?, explaining that he previously resided with his SO who kicked him out d/t Pt?s drinking.   o Pt confirmed address in EMR was his SO?s and previous D/C address (from 07/12/21) is also not an option  o Pt extremely apologetic re: his drinking habits and intoxicated behaviors.   o Pt endorsed to SW and ED Provider at bedside a desire to stop drinking. Pt stated ?I don?t want to go back to the streets; it?s cold out.?   o Discussed utilization of community detox. Pt open to this resource and indicated potentially previously utilizing community detox. Pt noted that his last drink was this morning prior to ED presentation.   o Pt inquiring about location of his ?pint of Fireball?. SW unable to advise at this time and Pt reported that he ?will not stop asking about it?.   ? Called [facility] (p. [phone]) and spoke w/ Sobering Unit staff. Informed of inability to accept new admits d/t current weather conditions.   ? Gathered warming shelter and substance abuse treatment resources.   ? Returned to bedside and discussed the following:   o Updated Pt on no availability for community detox. Outlined plan for Pt to D/C to local warming shelter.  o Pt again asking about location of his liquor. Pt stated ?it?s only gonna get worse? and expressed concerns re: withdrawal. Pt requested to ?extend [his] stay? to address these concerns.   o SW educated on appropriateness for admission at this time and directed further questions to ED provider.   o Discussed impact of cold weather conditions on available resources.  o Provided Pt w/ list of warming shelters and Substance Abuse Resources in [facility]. Pt had not further questions at this time.  ? Completed cab pass to [facility], [place] [address]) and provided to Triage staff.  o Noted estimated cost of $13.94 for 5.2 mi trip.     Other Considerations for Care Planning per primary MVP SWCM:  ? Link to substance use treatment as Pt is accepting  ? Link to sober living house, accessible after completion of detox   ? Link to mental health services as Pt is accepting; Consider CNS Psych consult in IP setting  ? Link to a community Sports coach for ongoing assistance for basic needs, obtaining ID, and obtaining permanent housing  ? Link to [employer] if medical condition changes      Carlynn Herald, LMSW  Available on Voalte  Work Cell: [phone])  [phone]

## 2021-11-20 NOTE — ED Provider Notes
Daniel French is a 50 y.o. male.    Chief Complaint:  Chief Complaint   Patient presents with   ? Alcohol intoxication   ? Head Laceration       History of Present Illness:  Patient is a 50 year old male with past medical history of alcoholism, depression, pancreatitis, and seizure who presents emergency room for fall.  Patient presents clinically intoxicated.  Reportedly had a fall after fall down a couple of stairs.  Patient locates pain to his head.  Denies chest pain or shortness of breath preceding the fall.  Denies numbness tingling or weakness.  Further history limited due to intoxication.          Review of Systems:  Review of Systems   Unable to perform ROS: Mental status change       Allergies:  Patient has French known allergies.    Past Medical History:  Medical History:   Diagnosis Date   ? Alcoholism (HCC)    ? Depression    ? Herniation of lumbar intervertebral disc    ? Nephrolithiasis    ? Pancreatitis    ? PTSD (post-traumatic stress disorder)    ? Seizure [facility])        Past Surgical History:  Surgical History:   Procedure Laterality Date   ? HX APPENDECTOMY     ? KIDNEY STONE SURGERY         Pertinent medical/surgical history reviewed    Social History:  Social History     Tobacco Use   ? Smoking status: Never   ? Smokeless tobacco: Current     Types: Chew   Vaping Use   ? Vaping Use: Never used   Substance Use Topics   ? Alcohol use: Not Currently     Comment: several weeks of bindge drinking   ? Drug use: Yes     Types: Methamphetamines, Cocaine, Heroin     Comment: pills, a baggie of some brown stuff.     Social History     Substance and Sexual Activity   Drug Use Yes   ? Types: Methamphetamines, Cocaine, Heroin    Comment: pills, a baggie of some brown stuff.             Family History:  Family History   Problem Relation Age of Onset   ? Heart Attack Other    ? Hypertension Other    ? None Reported Mother    ? Hypertension Father    ? Cancer Father    ? None Reported Sister    ? None Reported Brother    ? None Reported Sister    ? None Reported Brother        Vitals:  ED Vitals    Date and Time T BP P RR SPO2P SPO2 User   11/20/21 0245 -- -- 60 15 PER MINUTE 60 98 % ML   11/20/21 0138 36.7 ?C (98.1 ?F) -- -- -- -- -- [facility]   11/20/21 0031 -- -- 57 11 PER MINUTE 57 96 % MC   11/20/21 0030 -- 141/103 -- -- -- -- MC          Physical Exam:  Physical Exam  Vitals and nursing note reviewed.   Constitutional:       Appearance: Normal appearance. He is normal weight.   HENT:      Head: Normocephalic. Laceration present.     Eyes:      Extraocular Movements: Extraocular movements intact.  Conjunctiva/sclera: Conjunctivae normal.      Pupils: Pupils are equal, round, and reactive to light.   Cardiovascular:      Rate and Rhythm: Normal rate and regular rhythm.   Pulmonary:      Effort: Pulmonary effort is normal.      Breath sounds: Normal breath sounds.   Abdominal:      General: Bowel sounds are normal. There is French distension.      Palpations: Abdomen is soft.      Tenderness: There is French abdominal tenderness.   Musculoskeletal:         General: French swelling, tenderness or deformity. Normal range of motion.      Cervical back: Neck supple. French bony tenderness.      Thoracic back: French bony tenderness.      Lumbar back: French bony tenderness.   Skin:     General: Skin is warm and dry.   Neurological:      Mental Status: He is oriented to person, place, and time. Mental status is at baseline.      Sensory: French sensory deficit (Sensation intact to light touch in all extremities).      Motor: French weakness (Moving all extremities equally and spontaneously).   Psychiatric:         Mood and Affect: Mood normal.         Behavior: Behavior normal.         Laboratory Results:  Labs Reviewed   POC GLUCOSE - Abnormal       Result Value Ref Range Status    Glucose, POC 153 (*) 70 - 100 MG/DL Final   POC GLUCOSE     POC Glucose (Download): (!) 153    Radiology Interpretation:    CT HEAD WO CONTRAST   Final Result Head:       French acute intracranial hemorrhage or calvarial fracture.      Maxillofacial:       1.  French evidence of acute facial fracture or orbital hemorrhage.   2.  Right supraorbital scalp contusion/hematoma with associated laceration and mild preseptal periorbital extension.      Cervical Spine:       1.  French evidence of acute cervical spine fracture or traumatic subluxation.   2.  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             By my electronic signature, I attest that I have personally reviewed the images for this examination and formulated the interpretations and opinions expressed in this report          Finalized by Yash Coop, M.D. on 11/20/2021 2:14 AM. Dictated by Clide Dales, MD on 11/20/2021 1:35 AM.         CT SPINE CERVICAL WO CONTRAST   Final Result         Head:       French acute intracranial hemorrhage or calvarial fracture.      Maxillofacial:       1.  French evidence of acute facial fracture or orbital hemorrhage.   2.  Right supraorbital scalp contusion/hematoma with associated laceration and mild preseptal periorbital extension.      Cervical Spine:       1.  French evidence of acute cervical spine fracture or traumatic subluxation.   2.  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  By my electronic signature, I attest that I have personally reviewed the images for this examination and formulated the interpretations and opinions expressed in this report          Finalized by Locke Coop, M.D. on 11/20/2021 2:14 AM. Dictated by Clide Dales, MD on 11/20/2021 1:35 AM.         CT MAXIFACIAL/SINUS WO CONTRAST   Final Result         Head:       French acute intracranial hemorrhage or calvarial fracture.      Maxillofacial:       1.  French evidence of acute facial fracture or orbital hemorrhage.   2.  Right supraorbital scalp contusion/hematoma with associated laceration and mild preseptal periorbital extension.      Cervical Spine:       1.  French evidence of acute cervical spine fracture or traumatic subluxation.   2.  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             By my electronic signature, I attest that I have personally reviewed the images for this examination and formulated the interpretations and opinions expressed in this report          Finalized by Reyaansh Coop, M.D. on 11/20/2021 2:14 AM. Dictated by Clide Dales, MD on 11/20/2021 1:35 AM.               EKG:      Medical Decision Making:  Daniel French is a 50 y.o. male who presents with chief complaint as listed above. Based on the history and presentation, the list of differential diagnoses considered included, but was not limited to, intracranial injury, skull fracture, facial fracture, neck injury, alcohol intoxication    ED Course  50 y.o. male presented to the ED for fall and facial laceration in the context of alcohol intoxication.  - Patient was evaluated by resident and attending physicians.  - Available records were reviewed.  - Upon arrival to the ED patient mildly hypertensive, remainder vitals within normal limits.  -Exam as documented above.  -Imaging of head, max face, C-spine obtained.  Imaging without identified acute traumatic injury.  -Glucose within normal limits.  -Patient laceration repaired.  -Patient was monitored for clinical sobriety upon sobriety patient provided resources by ED social worker.  Patient discharged.    - Dispo: Discharge  - Patient reassessed with appropriate vital signs and symptom control for discharge.   - Provided patient with discharge information, strict return precautions, and follow up instructions. Patient verbalized understanding. All questions were answered prior to discharge. The patient was discharged from the emergency department in stable condition, demonstrating a clear understanding of return precautions and agreeable to follow up planning.          Complexity of Problems Addressed  Patient's active diagnoses as well as contributing pre-existing medical problems include:  Clinical Impression   None     Evaluation performed for potential threat to life or bodily function during this visit given the initial differential diagnosis and clinical impression(s) as discussed previously in MDM/ED course.    Additional data reviewed:    ? History was obtained from an independent historian: EMS  ? Prior non-ED notes reviewed: Outside ED record  ? Independent interpretation of diagnostic tests was performed by me: CT: French intracranial bleed  ? Patient presentation/management was discussed with the following qualified health care professionals and/or  other relevant professionals: Social Worker    Risk evaluation:    ? Diagnosis or treatment of patient condition impacted by social determinant of health: Impacted by substance use  ? Tests Considered but not performed due to clinical scoring (if not mentioned in ED course, aside from what is implied by clinical scores listed): N/A  ? Rationale regarding whether admission or escalation of care considered if not performed (if not mentioned in ED course, aside from what is implied by clinical scores listed): N/A    ED Scoring:                                MDM  Reviewed: previous chart, nursing note and vitals  Reviewed previous: labs, CT scan and x-ray  Interpretation: CT scan and labs        Facility Administered Meds:  Medications   lidocaine/racepinep/tetracaine topical gel (has French administration in time range)   sodium chloride irrigation 0.9 % bottle (has French administration in time range)       Clinical Impression:  Clinical Impression   None       Disposition/Follow up  ED Disposition     ED Disposition   French Disposition Selected           French follow-up provider specified.    Medications:  New Prescriptions    French medications on file       Procedure Notes:  Laceration Repair    Date/Time: 11/20/2021 12:18 AM  Performed by: Claudie Fisherman, MD  Authorized by: Hinton Rao, MD   Consent: Verbal consent obtained.  Risks and benefits: risks, benefits and alternatives were discussed  Consent given by: patient  Patient understanding: patient states understanding of the procedure being performed  Patient consent: the patient's understanding of the procedure matches consent given  Patient identity confirmed: verbally with patient and arm band  Body area: head/neck  Location details: right eyebrow  Laceration length: 2 cm  Tendon involvement: none  Nerve involvement: none  Vascular damage: French  Anesthesia: local infiltration    Anesthesia:  Local Anesthetic: lidocaine 1% without epinephrine  Anesthetic total: 5 mL    Sedation:  Patient sedated: French    Irrigation solution: saline  Irrigation method: jet lavage  Amount of cleaning: standard  Debridement: none  Degree of undermining: none  Skin closure: 5-0 Prolene  Number of sutures: 4  Technique: simple  Approximation: close  Approximation difficulty: simple  Patient tolerance: patient tolerated the procedure well with French immediate complications  Attending Attestation: I was present during the entire procedure by a resident or midlevel.        Attestation / Supervision:        Claudie Fisherman, MD

## 2021-11-20 NOTE — ED Notes
Patient came into the ED today by EMS due to alcohol intoxication. EMS reports 'he was at [place] and were not sure if he left AMA but someone called because he was passed out at the bottom of some stairs. Laceration above the R eyebrow and scraped up his knee. He is able to answer questions but he is heavily intoxicated. We are not sure how much he drank tonight'. Patient verbalizes drinking 'all day everyday'. Patient endorses head pain and rates the pain 8 out of 10. Patient denies chest pain and SOB. Patient states 'I am a bad person, all I do is drink but I am pretty good at it'. Even unlabored breathing. Bed is in lowest locked position, call light is within reach. C-spine precautions are in place.     ;hx   Medical History:   Diagnosis Date    Alcoholism ([place])     Depression     Herniation of lumbar intervertebral disc     Nephrolithiasis     Pancreatitis     PTSD (post-traumatic stress disorder)     Seizure ([place])

## 2021-11-20 NOTE — ED Notes
Discharge instructions reviewed with Pt. Pt denies any questions, comments or concerns at this time. Pt AOx4, denies any new SOA or chest pain. Pt discharged with all [facility] to [facility] via [facility].

## 2021-11-20 NOTE — ED Notes
C-spine precautions cleared at this time.

## 2022-04-30 ENCOUNTER — Encounter: Admit: 2022-04-30 | Discharge: 2022-04-30

## 2022-06-10 ENCOUNTER — Encounter: Admit: 2022-06-10 | Discharge: 2022-06-10

## 2022-07-30 ENCOUNTER — Encounter: Admit: 2022-07-30 | Discharge: 2022-07-30

## 2022-09-05 ENCOUNTER — Encounter: Admit: 2022-09-05 | Discharge: 2022-09-05

## 2022-09-05 ENCOUNTER — Emergency Department: Admit: 2022-09-05 | Discharge: 2022-09-05 | Discharge status: Against Medical Advice

## 2022-09-05 DIAGNOSIS — F10220 Alcohol dependence with intoxication, uncomplicated: Secondary | ICD-10-CM

## 2022-09-05 LAB — POC GLUCOSE: POC GLUCOSE: 77 mg/dL (ref 70–100)

## 2022-09-05 NOTE — ED Notes
Patient's Discharge Disposition: Left Before Treatment Complete Without Notification   Daniel French has been evaluated by a Qualified Medical Provider (physician or advanced practice provider).    Daniel French first called at 1335 with no response. A reasonable attempt was made to locate the patient.     A second attempt was made at 1350 with no response or answer from his phone. A reasonable attempt was made to locate the patient.     Patient did not notify staff of the intent to leave and staff did not have the opportunity to discuss refusal form.     Provider name: Dr. Vivien Presto was notified of left before treatment complete status at 1350.

## 2022-09-05 NOTE — ED Notes
72y M arrived to ED 77 with c/o Paranoia. Patient unable to describe his presenting complaint further due to ETOH. Patient unable to stay awake more than a few seconds without loud verbal reminders. Patient confused. Placed on CO2 monitoring, side rails up x 2, wrapped in blankets for seizure precautions.

## 2022-09-05 NOTE — ED Provider Notes
Daniel French is a 51 y.o. male.    Chief Complaint:  Chief Complaint   Patient presents with   ? Alcohol Intoxication     Paranoia. Obvious intoxication.        History of Present Illness:  Daniel French is a 51 year old male with history of alcohol use disorder presenting today for evaluation of alcohol intoxication.  Patient reports drinking several alcoholic beverages earlier today prior to arrival.  He is unable to elaborate on how much he drank or when his last drink was but does appear clinically intoxicated at this time.  He denies any other medical concerns including headache, chest pain, shortness of breath, abdominal pain.  No SI, HI, AVH.  History further limited by intoxication          Review of Systems:  Review of Systems   Unable to perform ROS: Other (intoxication)       Allergies:  Patient has no known allergies.    Past Medical History:  Medical History:   Diagnosis Date   ? Alcoholism (HCC)    ? Depression    ? Herniation of lumbar intervertebral disc    ? Nephrolithiasis    ? Pancreatitis    ? PTSD (post-traumatic stress disorder)    ? Seizure [facility])        Past Surgical History:  Surgical History:   Procedure Laterality Date   ? HX APPENDECTOMY     ? KIDNEY STONE SURGERY         Pertinent medical/surgical history reviewed    Social History:  Social History     Tobacco Use   ? Smoking status: Never   ? Smokeless tobacco: Current     Types: Chew   Vaping Use   ? Vaping Use: Never used   Substance Use Topics   ? Alcohol use: Not Currently     Comment: several weeks of bindge drinking   ? Drug use: Yes     Types: Methamphetamines, Cocaine, Heroin     Comment: pills, a baggie of some brown stuff.     Social History     Substance and Sexual Activity   Drug Use Yes   ? Types: Methamphetamines, Cocaine, Heroin    Comment: pills, a baggie of some brown stuff.             Family History:  Family History   Problem Relation Age of Onset   ? Heart Attack Other    ? Hypertension Other    ? None Reported Mother    ? Hypertension Father    ? Cancer Father    ? None Reported Sister    ? None Reported Brother    ? None Reported Sister    ? None Reported Brother        Vitals:  ED Vitals    Date and Time T BP P RR SPO2P SPO2 User   09/05/22 1322 -- -- 78 17 PER MINUTE -- -- OK   09/05/22 1300 -- -- 62 16 PER MINUTE -- -- LH   09/05/22 1238 -- -- 73 16 PER MINUTE -- -- LH   09/05/22 1230 -- -- 60 16 PER MINUTE -- -- LH   09/05/22 1201 -- -- 88 20 PER MINUTE 95 96 % LH   09/05/22 1155 -- -- 86 14 PER MINUTE 87 97 % LH   09/05/22 1100 -- 114/58 96 -- 96 99 % LH   09/05/22 1042 36.3 ?C (97.3 ?F) -- -- -- -- --  LH   09/05/22 1038 -- -- -- -- 82 100 % LH   09/05/22 1037 -- 146/82 -- 14 PER MINUTE -- -- LH          Physical Exam:  Physical Exam  Vitals and nursing note reviewed.   Constitutional:       Appearance: Normal appearance.      Comments: Intoxicated   HENT:      Head: Normocephalic and atraumatic.   Eyes:      Extraocular Movements: Extraocular movements intact.      Conjunctiva/sclera: Conjunctivae normal.   Cardiovascular:      Rate and Rhythm: Normal rate and regular rhythm.   Pulmonary:      Effort: Pulmonary effort is normal.      Breath sounds: Normal breath sounds.   Abdominal:      General: There is no distension.      Palpations: Abdomen is soft.      Tenderness: There is no abdominal tenderness.   Musculoskeletal:         General: Normal range of motion.      Cervical back: Neck supple.      Comments: No signs of trauma   Skin:     General: Skin is warm and dry.   Neurological:      General: No focal deficit present.   Psychiatric:         Mood and Affect: Mood normal.         Behavior: Behavior normal.         Laboratory Results:  Labs Reviewed   POC GLUCOSE       Result Value Ref Range Status    Glucose, POC 77  70 - 100 MG/DL Final   POC GLUCOSE     POC Glucose (Download): 77    Radiology Interpretation:    No orders to display         EKG:      Medical Decision Making:  Daniel French is a 51 y.o. male who presents with chief complaint as listed above. Based on the history and presentation, the list of differential diagnoses considered included, but was not limited to, alcohol intoxication, hypoglycemia    ED Course  Patient is a 51 year old male presenting for evaluation of alcohol intoxication.  On exam, patient clinically intoxicated but otherwise in no acute distress with no signs of trauma present on exam.  POC glucose within normal limits.  Patient was encouraged to p.o. ad lib. and orally rehydrate.  Had planned to observe until patient was clinically sober for reassessment but patient was later observed ambulating out of the emergency department with steady gait.  Attempt was made to contact patient but he did not answer his phone.       Complexity of Problems Addressed  Patient's active diagnoses as well as contributing pre-existing medical problems include:  Clinical Impression   Acute alcoholic intoxication in alcoholism without complication (HCC)          Additional data reviewed:    ? History was obtained from an independent historian: Not in addition to what is mentioned above  ? Prior non-ED notes reviewed: Not in addition to what is mentioned above  ? Independent interpretation of diagnostic tests was performed by me: Not in addition to what is mentioned above  ? Patient presentation/management was discussed with the following qualified health care professionals and/or other relevant professionals: Social Worker    Risk evaluation:    ? Diagnosis or  treatment of patient condition impacted by social determinant of health: None  ? Tests Considered but not performed due to clinical scoring (if not mentioned in ED course, aside from what is implied by clinical scores listed):   ? Rationale regarding whether admission or escalation of care considered if not performed (if not mentioned in ED course, aside from what is implied by clinical scores listed):     ED Scoring: Facility Administered Meds:  Medications - No data to display    Clinical Impression:  Clinical Impression   Acute alcoholic intoxication in alcoholism without complication (HCC)       Disposition/Follow up  ED Disposition     ED Disposition   LBTC           No follow-up provider specified.    Medications:  Discharge Medication List as of 09/05/2022  1:57 PM          Procedure Notes:  Procedures       Attestation / Supervision:      Drue Novel, MD

## 2022-10-14 ENCOUNTER — Encounter: Admit: 2022-10-14 | Discharge: 2022-10-14

## 2023-01-12 ENCOUNTER — Encounter: Admit: 2023-01-12 | Discharge: 2023-01-12

## 2023-01-12 ENCOUNTER — Emergency Department: Admit: 2023-01-12 | Discharge: 2023-01-12 | Disposition: A | Discharge status: Home / Self Care

## 2023-01-12 ENCOUNTER — Emergency Department: Admit: 2023-01-12 | Discharge: 2023-01-12

## 2023-01-12 DIAGNOSIS — W19XXXA Unspecified fall, initial encounter: Secondary | ICD-10-CM

## 2023-01-12 DIAGNOSIS — F10220 Alcohol dependence with intoxication, uncomplicated: Secondary | ICD-10-CM

## 2023-01-12 LAB — POC GLUCOSE: POC GLUCOSE: 110 mg/dL — ABNORMAL HIGH (ref 70–100)

## 2023-01-12 MED ORDER — FOLIC ACID 1 MG PO TAB
1 mg | Freq: Once | ORAL | 0 refills | Status: CP
Start: 2023-01-12 — End: ?
  Administered 2023-01-12: 15:00:00 1 mg via ORAL

## 2023-01-12 MED ORDER — THIAMINE MONONITRATE (VIT B1) 100 MG PO TAB
100 mg | Freq: Once | ORAL | 0 refills | Status: CP
Start: 2023-01-12 — End: ?
  Administered 2023-01-12: 15:00:00 100 mg via ORAL

## 2023-01-12 NOTE — Unmapped
You were seen today for alcohol intoxication and a fall.  Your work-up was reassuring. You should work with a primary care doctor to decrease you alcohol use.    Please follow up with the provider indicated in your discharge paperwork.    If you received any narcotic pain medications or sedatives while in the ED you should not drive/operate machinery or perform any high risk activity for 24 hours, or while on those medications. This includes any pain medication other than tylenol, ibuprofen, or toradol. This includes any medication for anxiety.    If your blood pressure is over 130/90, you should see your doctor to get your blood pressure rechecked.  Please report to nearest emergency department for any sudden decline in overall health, new symptoms, or any other concern.    Please contact medical records to obtain full records from your visit today and go over them with your primary doctor.    Thank you for allowing [place] to participate in your care.

## 2023-01-12 NOTE — ED Notes
Daniel French is a 52 yr old male who presents to ED 33 with CC alcohol intoxication. PT found in waiting room sleeping. Pt states he drank "a whole lot" of [place] today, pt unable to provide how much he drank or when last drink was. Pt denies any other drug use. PT has scrapes to bilateral knees, pt states he thinks he hit his head but does not remember falling. Denies HA, neck pain, CP, SOA, cough, fever, chills or any other concerns. Pt placed on cardiac, BP, and sp02 monitor. Bed in lowest locked position, side rails x2, call light placed within reach.     A&O to self and time, symmetrical chest expansion, breathing non-labored, skin warm dry intact and appropriate to race, equal strength in all extremities.     Belongings: shoes x2, socks x2, pants, shirt, pants, belt. All belongings at bedside with pt.

## 2023-01-21 ENCOUNTER — Encounter: Admit: 2023-01-21 | Discharge: 2023-01-21

## 2023-01-21 ENCOUNTER — Emergency Department
Admit: 2023-01-21 | Discharge: 2023-01-21 | Disposition: A | Discharge status: Home / Self Care | Attending: Student in an Organized Health Care Education/Training Program

## 2023-01-21 DIAGNOSIS — K859 Acute pancreatitis without necrosis or infection, unspecified: Secondary | ICD-10-CM

## 2023-01-21 DIAGNOSIS — N200 Calculus of kidney: Secondary | ICD-10-CM

## 2023-01-21 DIAGNOSIS — M5126 Other intervertebral disc displacement, lumbar region: Secondary | ICD-10-CM

## 2023-01-21 DIAGNOSIS — F1020 Alcohol dependence, uncomplicated: Secondary | ICD-10-CM

## 2023-01-21 DIAGNOSIS — F32A Depression: Secondary | ICD-10-CM

## 2023-01-21 DIAGNOSIS — F10920 Alcohol use, unspecified with intoxication, uncomplicated: Secondary | ICD-10-CM

## 2023-01-21 DIAGNOSIS — F4310 Post-traumatic stress disorder, unspecified: Secondary | ICD-10-CM

## 2023-01-21 DIAGNOSIS — R569 Unspecified convulsions: Secondary | ICD-10-CM

## 2023-01-21 NOTE — Progress Notes
Continuum of Care     Multiple Visit Patient - Case Management Note      NAME: Daniel French MRN: [id number] DOB: August 29, 1971  AGE: 52 y.o.    Admits in past 12 mos: ED Visits: 3 Admissions: 0     Plan: Reviewed for discharge planning assistance    Intervention:   Discussed Pt presentation w/ overnight ED SW, noting Pt's residence is located within 1 mile for [facility] and is appropriate to walk to upon likely ED D/C  give Pt's ambulatory capacity and current weather conditions     ED RN notified SW when Pt was ready to ED D/C.   Informed of Pt's request for shoes   Discussed transportation arrangements, noting ability to walk if returning to residence   Pt directed to waiting room for further assistance. Of note, Pt reported to ED RN that he was unsure is "his personality" would let him wait   Coordinated w/ Triage RN. Informed Pt left  department prior to further SW assistance.       Teresa Pelton, LMSW  Social Work Tourist information centre manager  Available on [employer] Phone: [phone]

## 2023-01-21 NOTE — ED Notes
Report taken from Hart Carwin., RN and care is assumed. Pt is asleep, VSS, breathing is non-labored, skin is appropriate to age and ethnicity. Patient on monitor, sitting up in bed, laying in bed, side rails x 2 up. Call light within reach. CO at bedside.

## 2023-01-21 NOTE — ED Notes
Pt A&Ox4 with no known complaints. Pt received education, denies any need for further information, and states understanding of material. Pt ambulated with steady gait out of ED with all of belongings.

## 2023-01-21 NOTE — ED Notes
11:31am: Patient is currently stable, he has been ambulatory in the department without any issues.  I have evaluated patient, he denies SI, or HI, no hallucinations.  Patient appears alert and oriented, and demonstrating adequate decision-making capacity.  Patient will be discharged.  Patient was signed out to me by Dr. Dema Severin, please refer to her note for full history and physical exam.

## 2023-01-21 NOTE — ED Notes
Pt A&O x4, able to tolerate PO intake, and ambulate with steady gait. MD notified at this time.

## 2023-02-23 ENCOUNTER — Encounter: Admit: 2023-02-23 | Discharge: 2023-02-23

## 2023-02-23 LAB — POC GLUCOSE: POC GLUCOSE: 287 mg/dL — ABNORMAL HIGH (ref 70–100)

## 2023-02-24 ENCOUNTER — Emergency Department: Admit: 2023-02-24 | Discharge: 2023-02-23

## 2023-02-24 ENCOUNTER — Encounter: Admit: 2023-02-24 | Discharge: 2023-02-24

## 2023-02-24 MED ADMIN — FOLIC ACID 5 MG/ML IJ SOLN [3232]: 50.000 mL | INTRAVENOUS | @ 08:00:00 | Stop: 2023-02-24 | NDC 39822110001

## 2023-02-24 MED ADMIN — ONDANSETRON HCL (PF) 4 MG/2 ML IJ SOLN [136012]: 4 mg | INTRAVENOUS | @ 07:00:00 | Stop: 2023-02-24 | NDC 00641607801

## 2023-02-24 MED ADMIN — LACTATED RINGERS IV SOLP [4318]: 1000 mL | INTRAVENOUS | @ 08:00:00 | Stop: 2023-02-24 | NDC 00338011704

## 2023-02-24 MED ADMIN — SODIUM CHLORIDE 0.9 % IV SOLP [27838]: 50.000 mL | INTRAVENOUS | @ 08:00:00 | Stop: 2023-02-24 | NDC 00338004931

## 2023-02-24 MED ADMIN — ONDANSETRON HCL (PF) 4 MG/2 ML IJ SOLN [136012]: 4 mg | INTRAVENOUS | @ 11:00:00 | Stop: 2023-02-24 | NDC 00641607801

## 2023-02-24 MED ADMIN — THIAMINE HCL (VITAMIN B1) 100 MG/ML IJ SOLN [7876]: 50.000 mL | INTRAVENOUS | @ 08:00:00 | Stop: 2023-02-24 | NDC 00641622801

## 2023-02-24 MED FILL — CHLORDIAZEPOXIDE HCL 25 MG PO CAP: 25 mg | 3 days supply | Qty: 13 | Fill #1 | Status: AC

## 2023-02-24 NOTE — ED Notes
Pt is a 52 y.o. male who came into the ED today with c/o alcohol intoxication. Patient reports relapsing 10 months ago. States he drank a 1/5 of vodka today. Has slurred speech. Aox4. Denies SI/HI. Endorses CP and SOA. Pt denies CP, SOB, N/V/D, constipation, abdominal pain, and/or fever/chills. Pt is alert and oriented, breathing is non-labored, skin is appropriate to age and ethnicity. Pt resting in bed, locked in lowest position, side rails up, call light in reach.    Belongings:  Shirt, pants, shoes, coat x2, phone

## 2023-03-07 ENCOUNTER — Encounter: Admit: 2023-03-07 | Discharge: 2023-03-07

## 2023-03-08 ENCOUNTER — Emergency Department: Admit: 2023-03-08 | Discharge: 2023-03-08

## 2023-03-08 ENCOUNTER — Encounter: Admit: 2023-03-08 | Discharge: 2023-03-08

## 2023-03-08 ENCOUNTER — Emergency Department: Admit: 2023-03-08 | Discharge: 2023-03-07

## 2023-03-08 MED ADMIN — MULTIVITAMIN PO TAB [37167]: 1 | ORAL | @ 15:00:00 | NDC 00536354710

## 2023-03-08 MED ADMIN — OXYCODONE 5 MG PO TAB [10814]: 10 mg | ORAL | @ 20:00:00 | NDC 00904696661

## 2023-03-08 MED ADMIN — FOLIC ACID 1 MG PO TAB [3233]: 1 mg | ORAL | @ 15:00:00 | NDC 00904722461

## 2023-03-08 MED ADMIN — SENNOSIDES 8.6 MG PO TAB [11349]: 1 | ORAL | @ 15:00:00 | NDC 00904725261

## 2023-03-08 MED ADMIN — SODIUM CHLORIDE 0.9 % IV SOLP [27838]: 839 mg | INTRAVENOUS | @ 14:00:00 | Stop: 2023-03-08 | NDC 00338004938

## 2023-03-08 MED ADMIN — ACETAMINOPHEN 325 MG PO TAB [101]: 650 mg | ORAL | @ 15:00:00 | NDC 00904677361

## 2023-03-08 MED ADMIN — POLYETHYLENE GLYCOL 3350 17 GRAM PO PWPK [25424]: 17 g | ORAL | @ 15:00:00 | NDC 00904693186

## 2023-03-08 MED ADMIN — SODIUM CHLORIDE 0.9 % IJ SOLN [7319]: 50 mL | INTRAVENOUS | @ 10:00:00 | Stop: 2023-03-08 | NDC 00409488820

## 2023-03-08 MED ADMIN — THIAMINE MONONITRATE (VIT B1) 100 MG PO TAB [303868]: 100 mg | ORAL | @ 15:00:00 | NDC 50268085111

## 2023-03-08 MED ADMIN — GABAPENTIN 300 MG PO CAP [18308]: 900 mg | ORAL | @ 20:00:00 | Stop: 2023-03-12 | NDC 67877022305

## 2023-03-08 MED ADMIN — PHENOBARBITAL SODIUM 130 MG/ML IJ SOLN [6221]: 839 mg | INTRAVENOUS | @ 14:00:00 | Stop: 2023-03-08 | NDC 42494041601

## 2023-03-08 MED ADMIN — ONDANSETRON HCL (PF) 4 MG/2 ML IJ SOLN [136012]: 4 mg | INTRAVENOUS | @ 15:00:00 | NDC 00641607801

## 2023-03-08 MED ADMIN — VITAMIN B COMPLEX PO TAB [799]: 1 | ORAL | @ 22:00:00 | NDC 00904418160

## 2023-03-08 MED ADMIN — ACETAMINOPHEN 325 MG PO TAB [101]: 650 mg | ORAL | @ 22:00:00 | NDC 00904677361

## 2023-03-08 MED ADMIN — IOHEXOL 350 MG IODINE/ML IV SOLN [81210]: 100 mL | INTRAVENOUS | @ 10:00:00 | Stop: 2023-03-08 | NDC 00407141491

## 2023-03-09 ENCOUNTER — Encounter: Admit: 2023-03-09 | Discharge: 2023-03-09

## 2023-03-09 MED ADMIN — MAGNESIUM SULFATE IN D5W 1 GRAM/100 ML IV PGBK [166578]: 1 g | INTRAVENOUS | @ 14:00:00 | Stop: 2023-03-09 | NDC 00264440054

## 2023-03-09 MED ADMIN — LORAZEPAM 1 MG PO TAB [4573]: 1 mg | ORAL | NDC 00904600861

## 2023-03-09 MED ADMIN — MULTIVITAMIN PO TAB [37167]: 1 | ORAL | @ 14:00:00 | NDC 00536354710

## 2023-03-09 MED ADMIN — ACETAMINOPHEN 325 MG PO TAB [101]: 650 mg | ORAL | @ 20:00:00 | NDC 00904677361

## 2023-03-09 MED ADMIN — VITAMIN B COMPLEX PO TAB [799]: 1 | ORAL | @ 14:00:00 | NDC 00904418160

## 2023-03-09 MED ADMIN — POLYETHYLENE GLYCOL 3350 17 GRAM PO PWPK [25424]: 17 g | ORAL | @ 14:00:00 | NDC 00904693186

## 2023-03-09 MED ADMIN — THIAMINE MONONITRATE (VIT B1) 100 MG PO TAB [303868]: 100 mg | ORAL | @ 14:00:00 | NDC 50268085111

## 2023-03-09 MED ADMIN — GABAPENTIN 300 MG PO CAP [18308]: 900 mg | ORAL | @ 20:00:00 | Stop: 2023-03-12 | NDC 67877022305

## 2023-03-09 MED ADMIN — CLONIDINE HCL 0.1 MG PO TAB [1755]: 0.1 mg | ORAL | @ 20:00:00 | Stop: 2023-03-09 | NDC 58657064701

## 2023-03-09 MED ADMIN — MELATONIN 3 MG PO TAB [16830]: 6 mg | ORAL | @ 03:00:00 | NDC 50268052411

## 2023-03-09 MED ADMIN — GABAPENTIN 300 MG PO CAP [18308]: 900 mg | ORAL | @ 03:00:00 | Stop: 2023-03-12 | NDC 67877022305

## 2023-03-09 MED ADMIN — SODIUM CHLORIDE 0.9 % IV SOLP [27838]: 250 mL | INTRAVENOUS | @ 14:00:00 | Stop: 2023-03-09 | NDC 00338004902

## 2023-03-09 MED ADMIN — LORAZEPAM 1 MG PO TAB [4573]: 1 mg | ORAL | @ 20:00:00 | NDC 00904600861

## 2023-03-09 MED ADMIN — ACETAMINOPHEN 325 MG PO TAB [101]: 650 mg | ORAL | @ 03:00:00 | NDC 00904677361

## 2023-03-09 MED ADMIN — CLONIDINE HCL 0.1 MG PO TAB [1755]: 0.1 mg | ORAL | @ 10:00:00 | Stop: 2023-03-09 | NDC 58657064701

## 2023-03-09 MED ADMIN — FOLIC ACID 1 MG PO TAB [3233]: 1 mg | ORAL | @ 14:00:00 | NDC 00904722461

## 2023-03-09 MED ADMIN — SENNOSIDES 8.6 MG PO TAB [11349]: 1 | ORAL | @ 03:00:00 | NDC 00904725261

## 2023-03-09 MED ADMIN — GABAPENTIN 300 MG PO CAP [18308]: 900 mg | ORAL | @ 10:00:00 | Stop: 2023-03-12 | NDC 67877022305

## 2023-03-09 MED ADMIN — POTASSIUM CHLORIDE 20 MEQ PO TBTQ [35943]: 40 meq | ORAL | @ 14:00:00 | Stop: 2023-03-09 | NDC 00832532510

## 2023-03-09 MED ADMIN — SENNOSIDES 8.6 MG PO TAB [11349]: 1 | ORAL | @ 14:00:00 | NDC 00904725261

## 2023-03-09 MED ADMIN — LORAZEPAM 1 MG PO TAB [4573]: 1 mg | ORAL | @ 15:00:00 | NDC 00904600861

## 2023-03-09 MED ADMIN — CLONIDINE HCL 0.1 MG PO TAB [1755]: 0.1 mg | ORAL | @ 03:00:00 | Stop: 2023-03-10 | NDC 60687011311

## 2023-03-09 MED ADMIN — ACETAMINOPHEN 325 MG PO TAB [101]: 650 mg | ORAL | @ 14:00:00 | NDC 00904677361

## 2023-03-10 MED ADMIN — OXYCODONE 5 MG PO TAB [10814]: 10 mg | ORAL | @ 06:00:00 | Stop: 2023-03-10 | NDC 00904696661

## 2023-03-10 MED ADMIN — MULTIVITAMIN PO TAB [37167]: 1 | ORAL | @ 15:00:00 | NDC 00536354710

## 2023-03-10 MED ADMIN — ACETAMINOPHEN 325 MG PO TAB [101]: 650 mg | ORAL | @ 22:00:00 | NDC 00904677361

## 2023-03-10 MED ADMIN — ACETAMINOPHEN 325 MG PO TAB [101]: 650 mg | ORAL | @ 02:00:00 | NDC 00904677361

## 2023-03-10 MED ADMIN — MELATONIN 3 MG PO TAB [16830]: 6 mg | ORAL | @ 02:00:00 | NDC 50268052411

## 2023-03-10 MED ADMIN — OXYCODONE 10 MG PO TAB [166908]: 10 mg | ORAL | @ 17:00:00 | Stop: 2023-03-10 | NDC 68084096811

## 2023-03-10 MED ADMIN — LORAZEPAM 1 MG PO TAB [4573]: 2 mg | ORAL | @ 22:00:00 | NDC 00904600861

## 2023-03-10 MED ADMIN — LORAZEPAM 1 MG PO TAB [4573]: 1 mg | ORAL | @ 15:00:00 | NDC 00904600861

## 2023-03-10 MED ADMIN — GABAPENTIN 300 MG PO CAP [18308]: 900 mg | ORAL | @ 19:00:00 | Stop: 2023-03-12 | NDC 67877022305

## 2023-03-10 MED ADMIN — LIDOCAINE 5 % TP PTMD [80759]: 1 | TOPICAL | @ 22:00:00 | NDC 00591352511

## 2023-03-10 MED ADMIN — OXYCODONE 5 MG PO TAB [10814]: 5 mg | ORAL | @ 22:00:00 | NDC 00904696661

## 2023-03-10 MED ADMIN — GABAPENTIN 300 MG PO CAP [18308]: 900 mg | ORAL | @ 10:00:00 | Stop: 2023-03-12 | NDC 67877022305

## 2023-03-10 MED ADMIN — SENNOSIDES 8.6 MG PO TAB [11349]: 1 | ORAL | @ 02:00:00 | NDC 00904725261

## 2023-03-10 MED ADMIN — POLYETHYLENE GLYCOL 3350 17 GRAM PO PWPK [25424]: 17 g | ORAL | @ 15:00:00 | Stop: 2023-03-10 | NDC 00904693186

## 2023-03-10 MED ADMIN — THIAMINE MONONITRATE (VIT B1) 100 MG PO TAB [303868]: 100 mg | ORAL | @ 15:00:00 | NDC 50268085111

## 2023-03-10 MED ADMIN — OXYCODONE 5 MG PO TAB [10814]: 10 mg | ORAL | @ 01:00:00 | NDC 00904696661

## 2023-03-10 MED ADMIN — SENNOSIDES 8.6 MG PO TAB [11349]: 1 | ORAL | @ 15:00:00 | Stop: 2023-03-10 | NDC 00904725261

## 2023-03-10 MED ADMIN — LORAZEPAM 1 MG PO TAB [4573]: 1 mg | ORAL | @ 19:00:00 | NDC 00904600861

## 2023-03-10 MED ADMIN — LORAZEPAM 1 MG PO TAB [4573]: 1 mg | ORAL | @ 10:00:00 | NDC 00904600861

## 2023-03-10 MED ADMIN — GABAPENTIN 300 MG PO CAP [18308]: 900 mg | ORAL | @ 02:00:00 | Stop: 2023-03-12 | NDC 67877022305

## 2023-03-10 MED ADMIN — FOLIC ACID 1 MG PO TAB [3233]: 1 mg | ORAL | @ 15:00:00 | NDC 00904722461

## 2023-03-10 MED ADMIN — ACETAMINOPHEN 325 MG PO TAB [101]: 650 mg | ORAL | @ 15:00:00 | NDC 00904677361

## 2023-03-10 MED ADMIN — VITAMIN B COMPLEX PO TAB [799]: 1 | ORAL | @ 15:00:00 | NDC 00904418160

## 2023-03-10 MED ADMIN — LORAZEPAM 1 MG PO TAB [4573]: 1 mg | ORAL | @ 04:00:00 | NDC 00904600861

## 2023-03-10 MED ADMIN — CLONIDINE HCL 0.1 MG PO TAB [1755]: 0.1 mg | ORAL | @ 02:00:00 | Stop: 2023-03-13 | NDC 58657064701

## 2023-03-11 MED ADMIN — OXYCODONE 5 MG PO TAB [10814]: 5 mg | ORAL | @ 10:00:00 | NDC 00904696661

## 2023-03-11 MED ADMIN — CLONIDINE HCL 0.1 MG PO TAB [1755]: 0.1 mg | ORAL | @ 13:00:00 | Stop: 2023-03-13 | NDC 60687011311

## 2023-03-11 MED ADMIN — HYDROXYZINE HCL 25 MG PO TAB [3774]: 25 mg | ORAL | @ 18:00:00 | NDC 00904661761

## 2023-03-11 MED ADMIN — VITAMIN B COMPLEX PO TAB [799]: 1 | ORAL | @ 13:00:00 | NDC 00904418160

## 2023-03-11 MED ADMIN — GABAPENTIN 300 MG PO CAP [18308]: 900 mg | ORAL | @ 20:00:00 | Stop: 2023-03-11 | NDC 67877022305

## 2023-03-11 MED ADMIN — MELATONIN 3 MG PO TAB [16830]: 6 mg | ORAL | @ 03:00:00 | NDC 50268052411

## 2023-03-11 MED ADMIN — ACETAMINOPHEN 325 MG PO TAB [101]: 650 mg | ORAL | @ 13:00:00 | NDC 00904677361

## 2023-03-11 MED ADMIN — SENNOSIDES 8.6 MG PO TAB [11349]: 1 | ORAL | @ 20:00:00 | NDC 00904725261

## 2023-03-11 MED ADMIN — GABAPENTIN 300 MG PO CAP [18308]: 900 mg | ORAL | @ 10:00:00 | Stop: 2023-03-11 | NDC 67877022305

## 2023-03-11 MED ADMIN — FOLIC ACID 1 MG PO TAB [3233]: 1 mg | ORAL | @ 13:00:00 | NDC 00904722461

## 2023-03-11 MED ADMIN — OXYCODONE 5 MG PO TAB [10814]: 5 mg | ORAL | @ 03:00:00 | NDC 00904696661

## 2023-03-11 MED ADMIN — OXYCODONE 5 MG PO TAB [10814]: 5 mg | ORAL | @ 07:00:00 | NDC 00904696661

## 2023-03-11 MED ADMIN — ACETAMINOPHEN 325 MG PO TAB [101]: 650 mg | ORAL | @ 03:00:00 | NDC 00904677361

## 2023-03-11 MED ADMIN — CLONIDINE HCL 0.1 MG PO TAB [1755]: 0.1 mg | ORAL | @ 03:00:00 | Stop: 2023-03-13 | NDC 60687011311

## 2023-03-11 MED ADMIN — MULTIVITAMIN PO TAB [37167]: 1 | ORAL | @ 13:00:00 | NDC 00536354710

## 2023-03-11 MED ADMIN — LORAZEPAM 1 MG PO TAB [4573]: 1 mg | ORAL | @ 03:00:00 | NDC 00904600861

## 2023-03-11 MED ADMIN — OXYCODONE 5 MG PO TAB [10814]: 5 mg | ORAL | @ 15:00:00 | NDC 00904696661

## 2023-03-11 MED ADMIN — ACETAMINOPHEN 325 MG PO TAB [101]: 650 mg | ORAL | @ 20:00:00 | NDC 00904677361

## 2023-03-11 MED ADMIN — OXYCODONE 5 MG PO TAB [10814]: 5 mg | ORAL | @ 20:00:00 | NDC 00904696661

## 2023-03-11 MED ADMIN — GABAPENTIN 300 MG PO CAP [18308]: 900 mg | ORAL | @ 03:00:00 | Stop: 2023-03-12 | NDC 67877022305

## 2023-03-11 MED ADMIN — THIAMINE MONONITRATE (VIT B1) 100 MG PO TAB [303868]: 100 mg | ORAL | @ 13:00:00 | NDC 50268085111

## 2023-03-12 ENCOUNTER — Encounter: Admit: 2023-03-12 | Discharge: 2023-03-12 | Payer: PRIVATE HEALTH INSURANCE

## 2023-03-12 MED ADMIN — VITAMIN B COMPLEX PO TAB [799]: 1 | ORAL | @ 14:00:00 | Stop: 2023-03-12 | NDC 00904418160

## 2023-03-12 MED ADMIN — MULTIVITAMIN PO TAB [37167]: 1 | ORAL | @ 14:00:00 | Stop: 2023-03-12 | NDC 00536354710

## 2023-03-12 MED ADMIN — CLONIDINE HCL 0.1 MG PO TAB [1755]: 0.1 mg | ORAL | @ 14:00:00 | Stop: 2023-03-12 | NDC 60687011311

## 2023-03-12 MED ADMIN — THIAMINE MONONITRATE (VIT B1) 100 MG PO TAB [303868]: 100 mg | ORAL | @ 14:00:00 | Stop: 2023-03-12 | NDC 50268085111

## 2023-03-12 MED ADMIN — ACETAMINOPHEN 325 MG PO TAB [101]: 650 mg | ORAL | @ 01:00:00 | NDC 00904677361

## 2023-03-12 MED ADMIN — GABAPENTIN 300 MG PO CAP [18308]: 600 mg | ORAL | @ 04:00:00 | Stop: 2023-03-14 | NDC 67877022305

## 2023-03-12 MED ADMIN — NICOTINE 21 MG/24 HR TD PT24 [27863]: 1 | TRANSDERMAL | @ 14:00:00 | Stop: 2023-03-12 | NDC 60505706300

## 2023-03-12 MED ADMIN — HYDROXYZINE HCL 25 MG PO TAB [3774]: 25 mg | ORAL | @ 04:00:00 | NDC 00904661761

## 2023-03-12 MED ADMIN — AMLODIPINE 5 MG PO TAB [79041]: 5 mg | ORAL | @ 14:00:00 | Stop: 2023-03-12 | NDC 00904637061

## 2023-03-12 MED ADMIN — ACETAMINOPHEN 325 MG PO TAB [101]: 650 mg | ORAL | @ 14:00:00 | Stop: 2023-03-12 | NDC 00904677361

## 2023-03-12 MED ADMIN — OXYCODONE 5 MG PO TAB [10814]: 5 mg | ORAL | @ 10:00:00 | Stop: 2023-03-12 | NDC 00904696661

## 2023-03-12 MED ADMIN — OXYCODONE 5 MG PO TAB [10814]: 5 mg | ORAL | @ 01:00:00 | NDC 00904696661

## 2023-03-12 MED ADMIN — OXYCODONE 5 MG PO TAB [10814]: 5 mg | ORAL | @ 15:00:00 | Stop: 2023-03-12 | NDC 00904696661

## 2023-03-12 MED ADMIN — GABAPENTIN 300 MG PO CAP [18308]: 600 mg | ORAL | @ 10:00:00 | Stop: 2023-03-12 | NDC 67877022305

## 2023-03-12 MED ADMIN — NICOTINE 21 MG/24 HR TD PT24 [27863]: 1 | TRANSDERMAL | @ 01:00:00 | NDC 60505706300

## 2023-03-12 MED ADMIN — CLONIDINE HCL 0.1 MG PO TAB [1755]: 0.1 mg | ORAL | @ 01:00:00 | Stop: 2023-03-13 | NDC 60687011311

## 2023-03-12 MED ADMIN — FOLIC ACID 1 MG PO TAB [3233]: 1 mg | ORAL | @ 14:00:00 | Stop: 2023-03-12 | NDC 00904722461

## 2023-03-12 MED ADMIN — SENNOSIDES 8.6 MG PO TAB [11349]: 1 | ORAL | @ 14:00:00 | Stop: 2023-03-12 | NDC 00904725261

## 2023-03-12 MED ADMIN — MELATONIN 3 MG PO TAB [16830]: 6 mg | ORAL | @ 04:00:00 | NDC 50268052411

## 2023-03-12 MED FILL — AMLODIPINE 5 MG PO TAB: 5 mg | ORAL | 30 days supply | Qty: 30 | Fill #1 | Status: CP

## 2023-03-12 MED FILL — CLONIDINE HCL 0.1 MG PO TAB: 0.1 mg | ORAL | 3 days supply | Qty: 3 | Fill #1 | Status: CP

## 2023-03-12 MED FILL — LIDOCAINE 5 % TP PTMD: 5 % | TOPICAL | 30 days supply | Qty: 30 | Fill #1 | Status: CP

## 2023-03-12 MED FILL — FOLIC ACID 1 MG PO TAB: 1 mg | ORAL | 30 days supply | Qty: 30 | Fill #1 | Status: CP

## 2023-03-12 MED FILL — OXYCODONE 5 MG PO TAB: 5 mg | ORAL | 2 days supply | Qty: 10 | Fill #1 | Status: CP

## 2023-03-12 MED FILL — DICLOFENAC SODIUM 1 % TP GEL: 1 % | TOPICAL | 25 days supply | Qty: 200 | Fill #1 | Status: CP

## 2023-03-12 MED FILL — GABAPENTIN 300 MG PO CAP: 300 mg | ORAL | 3 days supply | Qty: 12 | Fill #1 | Status: CP

## 2023-03-12 MED FILL — ESCITALOPRAM OXALATE 5 MG PO TAB: 5 mg | ORAL | 30 days supply | Qty: 30 | Fill #1 | Status: CP

## 2023-03-15 ENCOUNTER — Emergency Department: Admit: 2023-03-15 | Discharge: 2023-03-15 | Disposition: A | Discharge status: Home / Self Care

## 2023-03-15 ENCOUNTER — Encounter: Admit: 2023-03-15 | Discharge: 2023-03-15

## 2023-03-15 DIAGNOSIS — F10220 Alcohol dependence with intoxication, uncomplicated: Secondary | ICD-10-CM

## 2023-03-15 NOTE — ED Notes
SW at bedside providing pt w/ resources.

## 2023-03-15 NOTE — ED Provider Notes
Daniel French is a 52 y.o. male.    Chief Complaint:  Chief Complaint   Patient presents with    Alcohol intoxication     LUQ pain, last drink today.  Unable to hold a interview conversation due to rambling.         History of Present Illness:  Patient is a 52 year old male who presents today for alcohol intoxication.  Patient reports he had at least 5 beers today.  He reports regret for drinking today, reports desire for sobriety/rehab.  He has no other acute medical complaints.  On chart review, patient has a pattern of utilizing the emergency ferment when intoxicated to ask for alcohol withdrawal, but is unable to consistently remain sober.        Review of Systems:  Review of Systems   Constitutional:  Negative for diaphoresis and fever.        Intoxication   HENT:  Negative for sore throat and trouble swallowing.    Eyes:  Negative for pain and visual disturbance.   Respiratory:  Negative for chest tightness and shortness of breath.    Cardiovascular:  Negative for chest pain and palpitations.   Gastrointestinal:  Negative for abdominal pain, diarrhea, nausea and vomiting.   Genitourinary:  Negative for difficulty urinating, dysuria and hematuria.   Musculoskeletal:  Negative for back pain and neck pain.   Skin:  Negative for pallor and rash.   Neurological:  Negative for syncope and light-headedness.       Allergies:  Patient has no known allergies.    Past Medical History:  Medical History:   Diagnosis Date    Alcoholism (HCC)     Depression     Herniation of lumbar intervertebral disc     Nephrolithiasis     Pancreatitis     PTSD (post-traumatic stress disorder)     Seizure (HCC)        Past Surgical History:  Surgical History:   Procedure Laterality Date    HX APPENDECTOMY      KIDNEY STONE SURGERY         Pertinent medical/surgical history reviewed    Social History:  Social History     Tobacco Use    Smoking status: Never    Smokeless tobacco: Current     Types: Chew   Vaping Use    Vaping status: Never Used   Substance Use Topics    Alcohol use: Not Currently     Comment: several weeks of bindge drinking    Drug use: Yes     Types: Methamphetamines, Cocaine, Heroin     Comment: pills, a baggie of some brown stuff.     Social History     Substance and Sexual Activity   Drug Use Yes    Types: Methamphetamines, Cocaine, Heroin    Comment: pills, a baggie of some brown stuff.             Family History:  Family History   Problem Relation Age of Onset    Heart Attack Other     Hypertension Other     None Reported Mother     Hypertension Father     Cancer Father     None Reported Sister     None Reported Brother     None Reported Sister     None Reported Brother        Vitals:  ED Vitals      Date and Time T BP P RR SPO2P  SPO2 User   03/15/23 1126 -- -- 88 -- -- 97 % LM   03/15/23 1036 36.3 ?C (97.3 ?F) 130/54 77 20 PER MINUTE -- 95 % MM            Physical Exam:  Physical Exam  Vitals and nursing note reviewed.   Constitutional:       General: He is not in acute distress.     Appearance: He is not ill-appearing.      Comments: Intoxicated   HENT:      Head: Normocephalic and atraumatic.      Mouth/Throat:      Mouth: Mucous membranes are moist.      Pharynx: Oropharynx is clear.   Eyes:      Extraocular Movements: Extraocular movements intact.      Pupils: Pupils are equal, round, and reactive to light.   Cardiovascular:      Rate and Rhythm: Normal rate and regular rhythm.      Pulses: Normal pulses.      Heart sounds: Normal heart sounds.   Pulmonary:      Effort: Pulmonary effort is normal.      Breath sounds: Normal breath sounds.   Abdominal:      General: There is no distension.      Palpations: Abdomen is soft.      Tenderness: There is no abdominal tenderness.   Musculoskeletal:         General: No deformity. Normal range of motion.   Skin:     General: Skin is warm and dry.      Capillary Refill: Capillary refill takes less than 2 seconds.   Neurological:      General: No focal deficit present. Mental Status: He is alert and oriented to person, place, and time.         Laboratory Results:  Labs Reviewed - No data to display       Radiology Interpretation:  No orders to display       EKG:      Medical Decision Making:  Daniel French is a 52 y.o. male who presents with chief complaint as listed above. Based on the history and presentation, the list of differential diagnoses considered included, but was not limited to, alcohol intoxication, alcohol abuse, malingering    ED Course  Patient presents for alcohol intoxication.  On chart review, has a pattern of utilizing emergency departments when intoxicated to express desire for rehab, but is unable to consistently remain sober, does not follow through with rehab recommendations.  Social work provided patient outpatient rehabilitative resources.  Patient was noted more department for short period of time as he continued to sober up.  After several hours, patient was ambulatory, no threat or danger to himself, and left the emergency department without difficulty.  As patient left before final discharge discussion/planning, patient left before treatment complete.       Complexity of Problems Addressed  Patient's active diagnoses as well as contributing pre-existing medical problems include:  Clinical Impression   Acute alcoholic intoxication in alcoholism without complication (HCC)     Evaluation performed for potential threat to life or bodily function during this visit given the initial differential diagnosis and clinical impression(s) as discussed previously in MDM/ED course.    Additional data reviewed:    History was obtained from an independent historian: Not in addition to what is mentioned above  Prior non-ED notes reviewed: Recent discharge summary and/or H&P and Outside ED record  Independent interpretation of diagnostic tests was performed by me: Not in addition to what is mentioned above  Patient presentation/management was discussed with the following qualified health care professionals and/or other relevant professionals: Social Worker    Risk evaluation:    Diagnosis or treatment of patient condition impacted by social determinant of health: Impacted by substance use and Problems related to substance use  Tests Considered but not performed due to clinical scoring (if not mentioned in ED course, aside from what is implied by clinical scores listed): None  Rationale regarding whether admission or escalation of care considered if not performed (if not mentioned in ED course, aside from what is implied by clinical scores listed): None    ED Scoring:                                Facility Administered Meds:  Medications - No data to display    Clinical Impression:  Clinical Impression   Acute alcoholic intoxication in alcoholism without complication (HCC)       Disposition/Follow up  ED Disposition     ED Disposition   [facility]           No follow-up provider specified.    Medications:  Discharge Medication List as of 03/15/2023 12:44 PM          Procedure Notes:  Procedures       Attestation / Supervision:        Lujean Amel, MD

## 2023-03-15 NOTE — ED Notes
Patient's Discharge Disposition: Left Before Treatment Complete With Notification   Daniel French has been evaluated by a Qualified Medical Provider (physician or advanced practice provider) and informed staff that they desired to leave the Emergency Department. Patient was encouraged to stay and remain to talk with a Qualified Medical Provider. Was patient willing to wait to discuss risks of leaving with a Qualified Medical Provider? no     The risks of leaving and benefits of staying to receive Medical Screening Examination discussed with Patient and refusal form completed. This form was NOT signed by patient and/or their representative.     The stated reason(s) for leaving were: personal reasons.    Daniel French left before treatment complete at 1228. Daniel French encouraged to return to ED for new or worsening symptoms or if they change their mind.    Condition of patient when they left ED: alert & oriented, respirations even and non-labored, and skin tone normal for race, warm and dry.    Means of departure: ambulatory out of ED with steady gait.

## 2023-03-22 ENCOUNTER — Encounter: Admit: 2023-03-22 | Discharge: 2023-03-22

## 2023-03-23 ENCOUNTER — Emergency Department: Admit: 2023-03-23 | Discharge: 2023-03-23

## 2023-03-23 ENCOUNTER — Encounter: Admit: 2023-03-23 | Discharge: 2023-03-23

## 2023-03-23 ENCOUNTER — Emergency Department: Admit: 2023-03-23 | Discharge: 2023-03-22

## 2023-03-23 MED ADMIN — POTASSIUM CHLORIDE 20 MEQ PO TBTQ [35943]: 40 meq | ORAL | @ 13:00:00 | Stop: 2023-03-23 | NDC 00832532510

## 2023-03-23 MED ADMIN — ACETAMINOPHEN 325 MG PO TAB [101]: 650 mg | ORAL | @ 10:00:00 | Stop: 2023-03-23 | NDC 00904677361

## 2023-04-12 ENCOUNTER — Encounter: Admit: 2023-04-12 | Discharge: 2023-04-12

## 2023-04-13 ENCOUNTER — Encounter: Admit: 2023-04-13 | Discharge: 2023-04-13

## 2023-04-13 ENCOUNTER — Emergency Department: Admit: 2023-04-13 | Discharge: 2023-04-12

## 2023-04-13 DIAGNOSIS — F1020 Alcohol dependence, uncomplicated: Secondary | ICD-10-CM

## 2023-04-13 DIAGNOSIS — R569 Unspecified convulsions: Secondary | ICD-10-CM

## 2023-04-13 DIAGNOSIS — K859 Acute pancreatitis without necrosis or infection, unspecified: Secondary | ICD-10-CM

## 2023-04-13 DIAGNOSIS — M5126 Other intervertebral disc displacement, lumbar region: Secondary | ICD-10-CM

## 2023-04-13 DIAGNOSIS — F4310 Post-traumatic stress disorder, unspecified: Secondary | ICD-10-CM

## 2023-04-13 DIAGNOSIS — F32A Depression: Secondary | ICD-10-CM

## 2023-04-13 DIAGNOSIS — N200 Calculus of kidney: Secondary | ICD-10-CM

## 2023-04-13 MED FILL — DOXYCYCLINE HYCLATE 100 MG PO TAB: 100 mg | ORAL | 7 days supply | Qty: 14 | Fill #1 | Status: AC

## 2023-04-23 ENCOUNTER — Encounter: Admit: 2023-04-23 | Discharge: 2023-04-23

## 2023-05-12 ENCOUNTER — Encounter: Admit: 2023-05-12 | Discharge: 2023-05-12

## 2023-08-04 ENCOUNTER — Encounter: Admit: 2023-08-04 | Discharge: 2023-08-04

## 2023-08-31 ENCOUNTER — Encounter: Admit: 2023-08-31 | Discharge: 2023-08-31

## 2023-11-04 ENCOUNTER — Encounter: Admit: 2023-11-04 | Discharge: 2023-11-04

## 2023-11-12 ENCOUNTER — Encounter: Admit: 2023-11-12 | Discharge: 2023-11-12

## 2023-11-26 ENCOUNTER — Encounter: Admit: 2023-11-26 | Discharge: 2023-11-26

## 2023-11-26 ENCOUNTER — Emergency Department: Admit: 2023-11-26 | Discharge: 2023-11-26 | Discharge status: Against Medical Advice

## 2023-12-10 ENCOUNTER — Encounter: Admit: 2023-12-10 | Discharge: 2023-12-10

## 2023-12-10 ENCOUNTER — Emergency Department: Admit: 2023-12-10 | Discharge: 2023-12-10 | Disposition: A | Discharge status: Home / Self Care

## 2023-12-10 MED FILL — CEPHALEXIN 500 MG PO CAP: 500 mg | ORAL | 7 days supply | Status: CN

## 2023-12-21 ENCOUNTER — Encounter: Admit: 2023-12-21 | Discharge: 2023-12-21

## 2024-01-12 ENCOUNTER — Encounter: Admit: 2024-01-12 | Discharge: 2024-01-12

## 2024-06-18 ENCOUNTER — Emergency Department: Admit: 2024-06-18 | Discharge: 2024-06-18 | Disposition: A | Discharge status: Home / Self Care

## 2024-06-18 ENCOUNTER — Encounter: Admit: 2024-06-18 | Discharge: 2024-06-18

## 2024-06-18 ENCOUNTER — Emergency Department: Admit: 2024-06-18 | Discharge: 2024-06-18

## 2024-08-06 ENCOUNTER — Encounter: Admit: 2024-08-06 | Discharge: 2024-08-06

## 2024-12-08 ENCOUNTER — Encounter: Admit: 2024-12-08 | Discharge: 2024-12-08
